# Patient Record
Sex: Male | Born: 1975 | Race: White | Hispanic: No | Marital: Single | Smoking: Heavy tobacco smoker
Health system: Southern US, Community
[De-identification: ages and names within clinical notes are randomized; demographics above are authoritative.]

## PROBLEM LIST (undated history)

## (undated) DIAGNOSIS — F1010 Alcohol abuse, uncomplicated: Secondary | ICD-10-CM

## (undated) DIAGNOSIS — I10 Essential (primary) hypertension: Secondary | ICD-10-CM

---

## 1999-09-03 ENCOUNTER — Emergency Department (HOSPITAL_COMMUNITY): Admission: EM | Admit: 1999-09-03 | Discharge: 1999-09-03 | Payer: Self-pay | Admitting: Emergency Medicine

## 1999-10-26 ENCOUNTER — Emergency Department (HOSPITAL_COMMUNITY): Admission: EM | Admit: 1999-10-26 | Discharge: 1999-10-26 | Payer: Self-pay | Admitting: *Deleted

## 2000-02-29 ENCOUNTER — Emergency Department (HOSPITAL_COMMUNITY): Admission: EM | Admit: 2000-02-29 | Discharge: 2000-02-29 | Payer: Self-pay | Admitting: Emergency Medicine

## 2000-08-01 ENCOUNTER — Emergency Department (HOSPITAL_COMMUNITY): Admission: EM | Admit: 2000-08-01 | Discharge: 2000-08-01 | Payer: Self-pay | Admitting: Emergency Medicine

## 2001-05-19 ENCOUNTER — Emergency Department (HOSPITAL_COMMUNITY): Admission: EM | Admit: 2001-05-19 | Discharge: 2001-05-19 | Payer: Self-pay | Admitting: *Deleted

## 2005-08-26 ENCOUNTER — Emergency Department: Payer: Self-pay | Admitting: Emergency Medicine

## 2006-09-21 ENCOUNTER — Emergency Department: Payer: Self-pay

## 2007-03-21 ENCOUNTER — Emergency Department (HOSPITAL_COMMUNITY): Admission: EM | Admit: 2007-03-21 | Discharge: 2007-03-21 | Payer: Self-pay | Admitting: Emergency Medicine

## 2009-01-04 ENCOUNTER — Emergency Department: Payer: Self-pay | Admitting: Internal Medicine

## 2009-05-31 ENCOUNTER — Emergency Department: Payer: Self-pay | Admitting: Emergency Medicine

## 2009-07-09 ENCOUNTER — Emergency Department: Payer: Self-pay | Admitting: Emergency Medicine

## 2009-08-22 ENCOUNTER — Emergency Department: Payer: Self-pay | Admitting: Emergency Medicine

## 2010-01-15 ENCOUNTER — Emergency Department: Payer: Self-pay | Admitting: Emergency Medicine

## 2010-02-27 ENCOUNTER — Emergency Department: Payer: Self-pay | Admitting: Emergency Medicine

## 2010-03-27 ENCOUNTER — Emergency Department: Payer: Self-pay | Admitting: Emergency Medicine

## 2012-03-18 IMAGING — CT CT HEAD WITHOUT CONTRAST
2 series · 15 of 30 positions shown, 19 images · non-contrast
Comparison: none

REASON FOR EXAM: worsening ha , pain into right eye
COMMENTS:   LMP: (Male)

PROCEDURE:     CT  - CT HEAD WITHOUT CONTRAST  - March 27, 2010 [DATE]
RESULT:
TECHNIQUE: Noncontrast emergent CT of the brain is performed in the standard
fashion and compared to studies dated 02/27/2010.

[Series 2: without · axial · non-contrast · 0.40mm/px · z∈[+740,+860]mm · 13 of 30 slices shown, 17 images]
[im 3/30  brain]
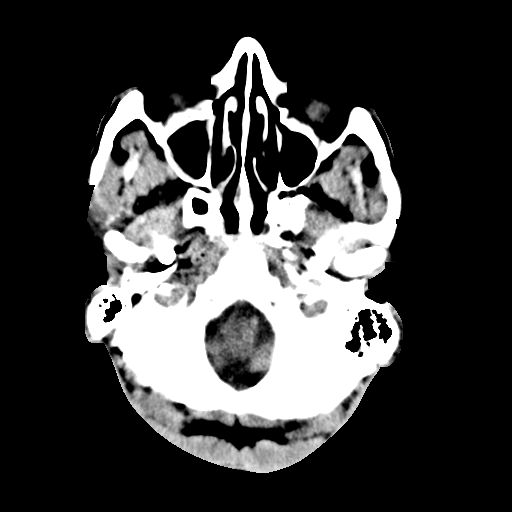
[im 3/30  bone]
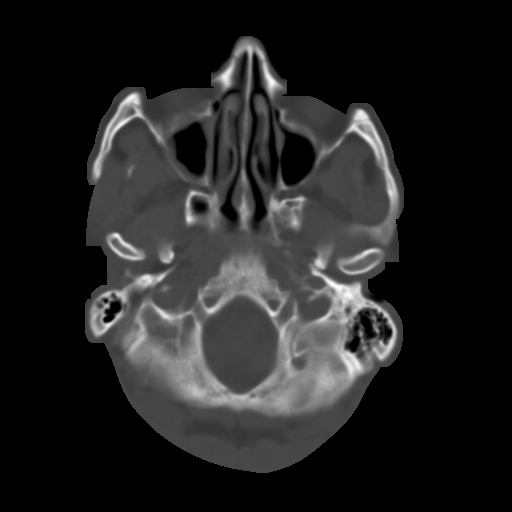
[im 5/30  brain]
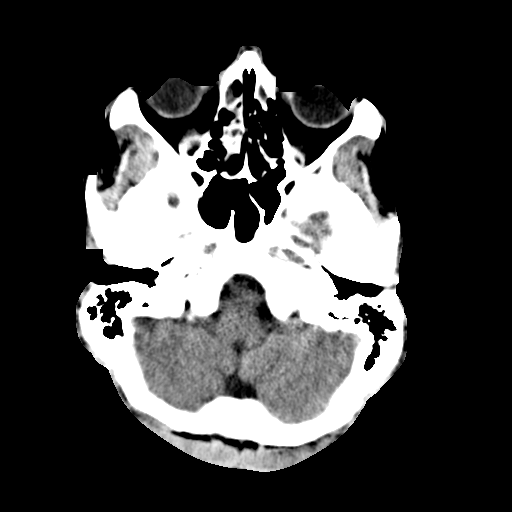
[im 7/30  brain]
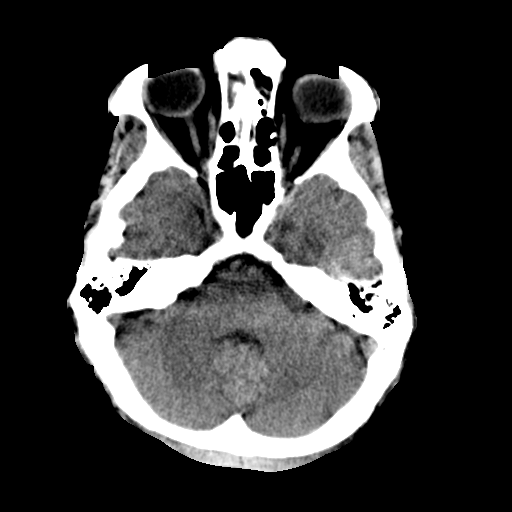
[im 9/30  brain]
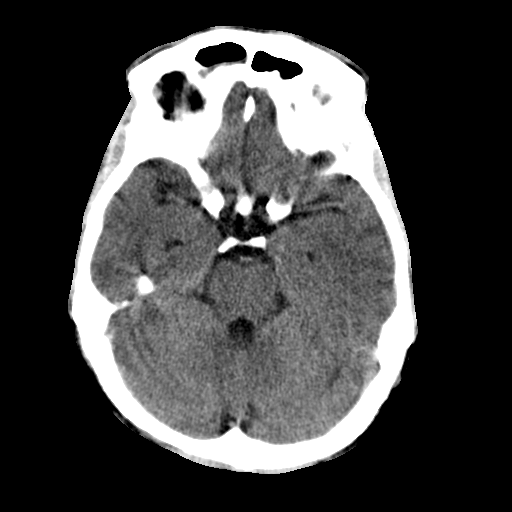
[im 11/30  brain]
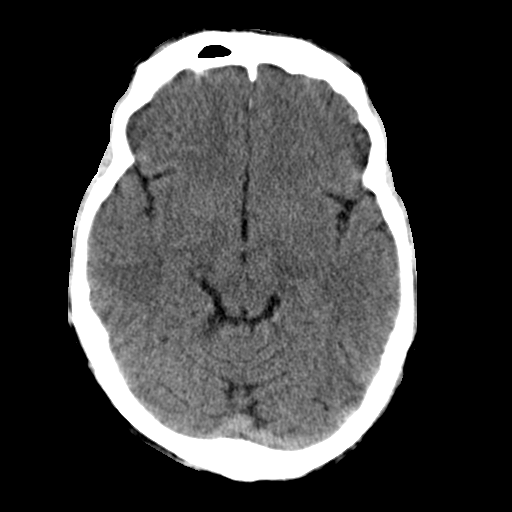
[im 11/30  bone]
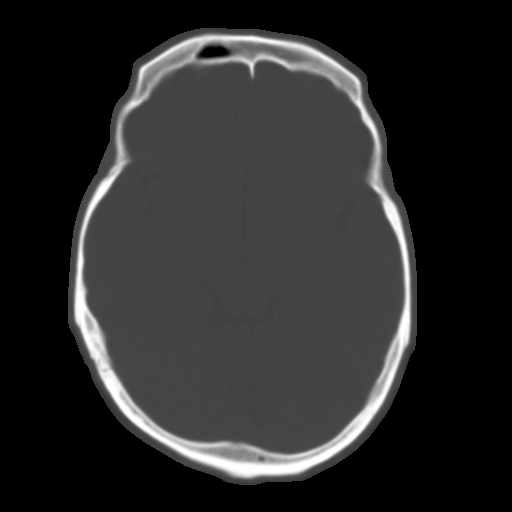
[im 13/30  brain]
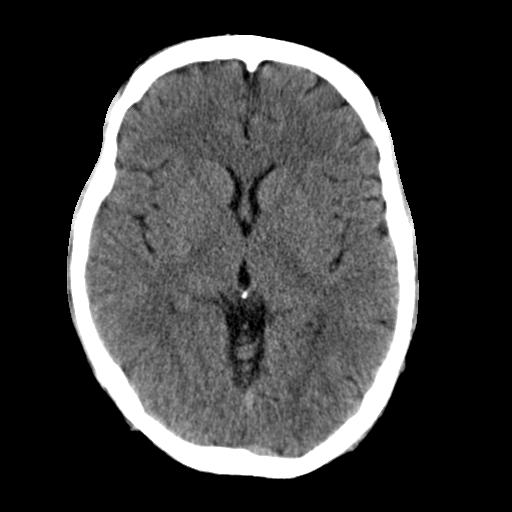
[im 15/30  brain]
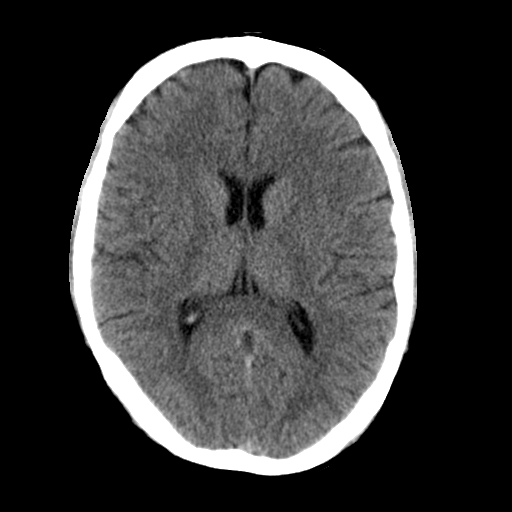
[im 17/30  brain]
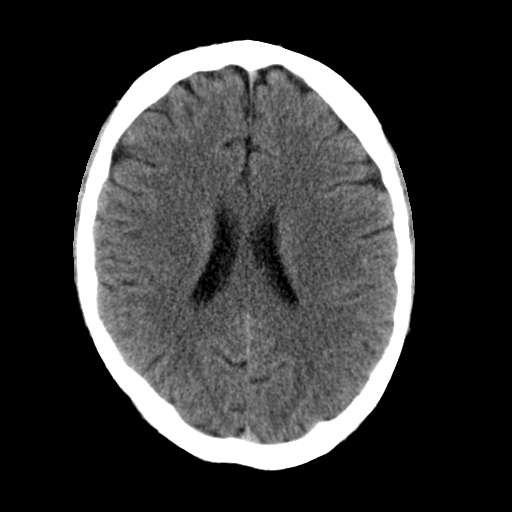
[im 19/30  brain]
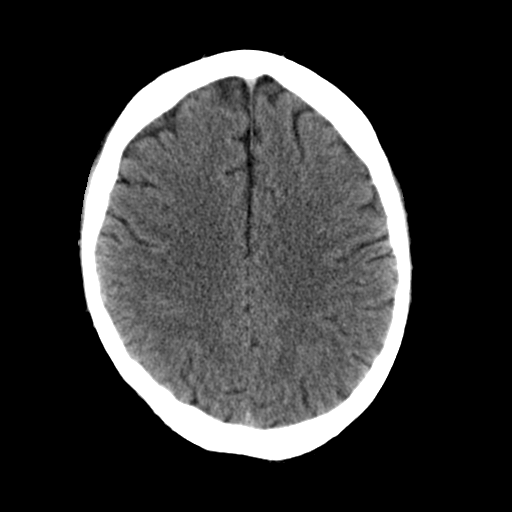
[im 19/30  bone]
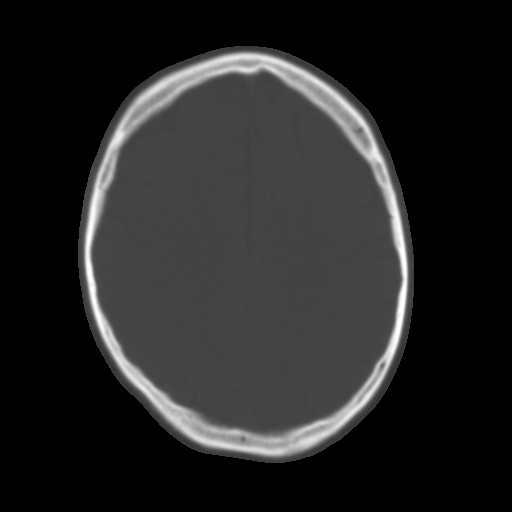
[im 21/30  brain]
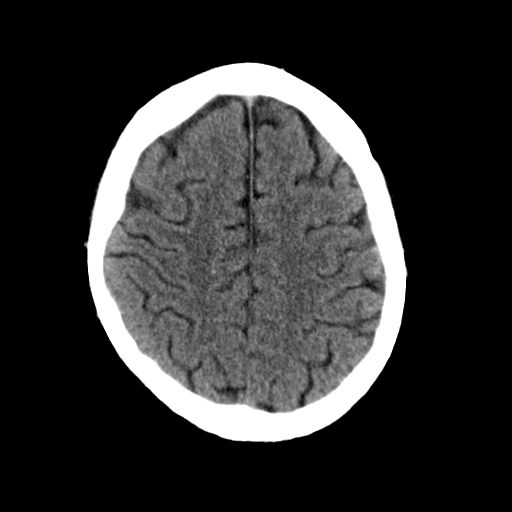
[im 23/30  brain]
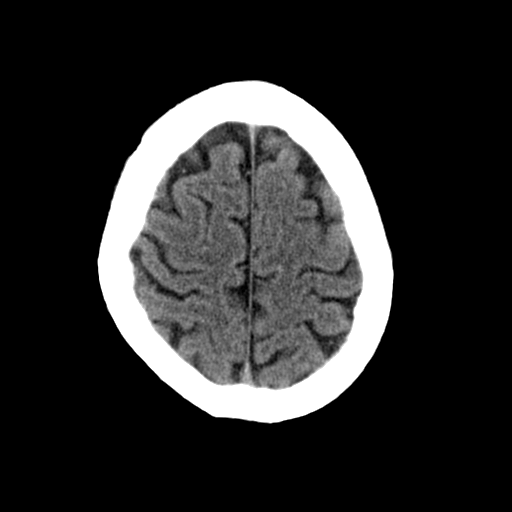
[im 25/30  brain]
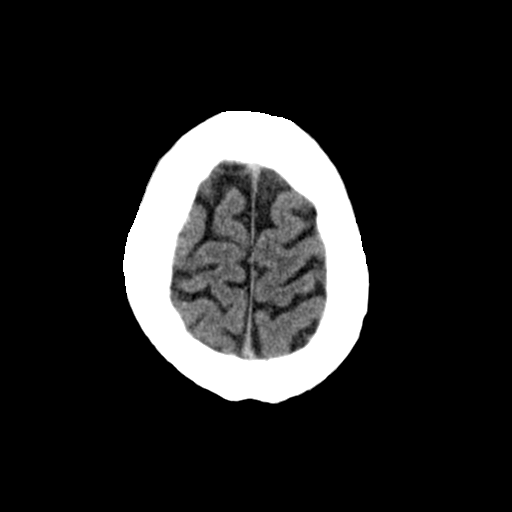
[im 27/30  brain]
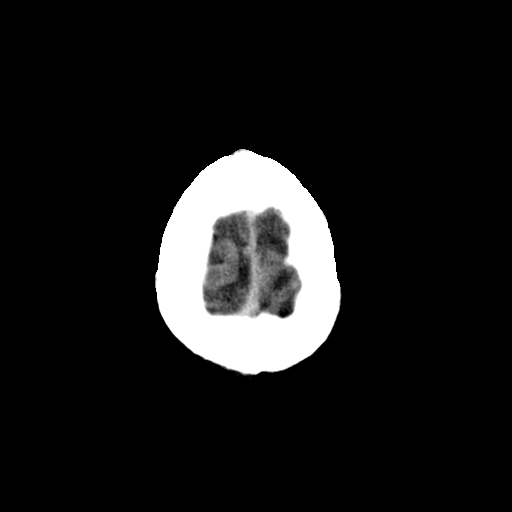
[im 27/30  bone]
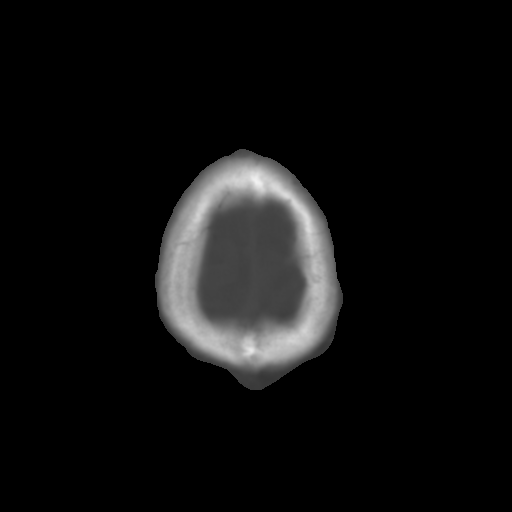

[Series 3: bone · axial · 0.40mm/px · z∈[+740,+760]mm · 2 of 30 slices shown]
[im 3/30  bone]
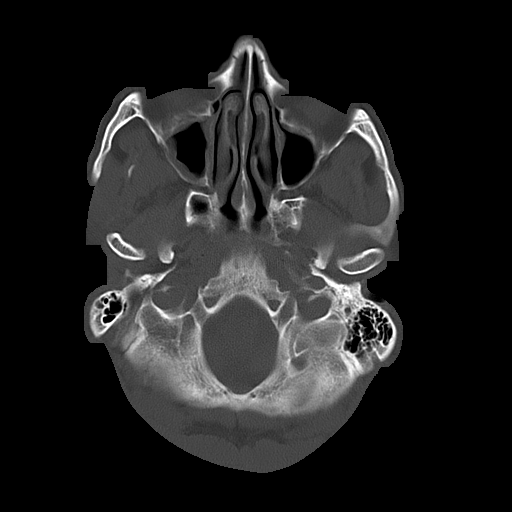
[im 7/30  bone]
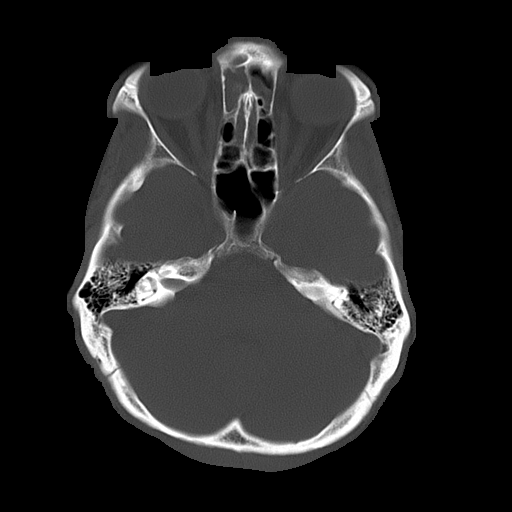

[15 of 30 positions shown; findings below may reference images not displayed]

FINDINGS: There is mucosal thickening diffusely in the right maxillary sinus
and in the ethmoid air cells bilaterally as well as in the right frontal
sinus. The sphenoid sinuses, left frontal and left maxillary sinus appear to
be relatively clear. There is no opacity in the mastoid air cells. The
calvarium is intact. The ventricles and sulci appear to be normal. There is
no intracranial hemorrhage, mass effect or midline shift. There is no
territorial infarct.
IMPRESSION: 1.     Changes of mucosal thickening in the paranasal sinuses as described
without an acute air/fluid level. These may represent changes of chronic
sinus disease.
2.     There is no evidence of an acute intracranial abnormality.

## 2016-02-25 ENCOUNTER — Encounter: Payer: Self-pay | Admitting: Emergency Medicine

## 2016-02-25 ENCOUNTER — Emergency Department
Admission: EM | Admit: 2016-02-25 | Discharge: 2016-02-26 | Disposition: A | Discharge status: Home / Self Care | Payer: Self-pay | Attending: Emergency Medicine | Admitting: Emergency Medicine

## 2016-02-25 ENCOUNTER — Emergency Department: Payer: Self-pay

## 2016-02-25 DIAGNOSIS — S61412A Laceration without foreign body of left hand, initial encounter: Secondary | ICD-10-CM | POA: Insufficient documentation

## 2016-02-25 DIAGNOSIS — Y939 Activity, unspecified: Secondary | ICD-10-CM | POA: Insufficient documentation

## 2016-02-25 DIAGNOSIS — L02512 Cutaneous abscess of left hand: Secondary | ICD-10-CM | POA: Insufficient documentation

## 2016-02-25 DIAGNOSIS — L02519 Cutaneous abscess of unspecified hand: Secondary | ICD-10-CM

## 2016-02-25 DIAGNOSIS — S61432A Puncture wound without foreign body of left hand, initial encounter: Secondary | ICD-10-CM | POA: Insufficient documentation

## 2016-02-25 DIAGNOSIS — L03119 Cellulitis of unspecified part of limb: Secondary | ICD-10-CM

## 2016-02-25 DIAGNOSIS — S62609B Fracture of unspecified phalanx of unspecified finger, initial encounter for open fracture: Secondary | ICD-10-CM

## 2016-02-25 DIAGNOSIS — S62613B Displaced fracture of proximal phalanx of left middle finger, initial encounter for open fracture: Secondary | ICD-10-CM | POA: Insufficient documentation

## 2016-02-25 DIAGNOSIS — Y999 Unspecified external cause status: Secondary | ICD-10-CM | POA: Insufficient documentation

## 2016-02-25 DIAGNOSIS — Y929 Unspecified place or not applicable: Secondary | ICD-10-CM | POA: Insufficient documentation

## 2016-02-25 DIAGNOSIS — F17200 Nicotine dependence, unspecified, uncomplicated: Secondary | ICD-10-CM | POA: Insufficient documentation

## 2016-02-25 LAB — BASIC METABOLIC PANEL
ANION GAP: 9 (ref 5–15)
BUN: 6 mg/dL (ref 6–20)
CHLORIDE: 100 mmol/L — AB (ref 101–111)
CO2: 28 mmol/L (ref 22–32)
CREATININE: 0.74 mg/dL (ref 0.61–1.24)
Calcium: 8.9 mg/dL (ref 8.9–10.3)
GFR calc non Af Amer: >60 mL/min (ref >60)
Glucose, Bld: 100 mg/dL — ABNORMAL HIGH (ref 65–99)
POTASSIUM: 3.7 mmol/L (ref 3.5–5.1)
SODIUM: 137 mmol/L (ref 135–145)

## 2016-02-25 LAB — CBC WITH DIFFERENTIAL/PLATELET
Basophils Absolute: 0.1 10*3/uL (ref 0–0.1)
Basophils Relative: 1 %
Eosinophils Absolute: 0.4 10*3/uL (ref 0–0.7)
HEMATOCRIT: 48.6 % (ref 40.0–52.0)
Hemoglobin: 16.5 g/dL (ref 13.0–18.0)
LYMPHS ABS: 2.4 10*3/uL (ref 1.0–3.6)
MCH: 29.2 pg (ref 26.0–34.0)
MCHC: 33.9 g/dL (ref 32.0–36.0)
MCV: 86.1 fL (ref 80.0–100.0)
Monocytes Absolute: 0.6 10*3/uL (ref 0.2–1.0)
Monocytes Relative: 4 %
NEUTROS ABS: 10 10*3/uL — AB (ref 1.4–6.5)
PLATELETS: 217 10*3/uL (ref 150–440)
RBC: 5.65 MIL/uL (ref 4.40–5.90)
RDW: 14.7 % — ABNORMAL HIGH (ref 11.5–14.5)
WBC: 13.4 10*3/uL — AB (ref 3.8–10.6)

## 2016-02-25 MED ORDER — LIDOCAINE HCL (PF) 1 % IJ SOLN
5.0000 mL | Freq: Once | INTRAMUSCULAR | Status: AC
  Administered 2016-02-25: 5 mL
  Filled 2016-02-25: qty 5

## 2016-02-25 MED ORDER — ONDANSETRON HCL 4 MG/2ML IJ SOLN
4.0000 mg | Freq: Once | INTRAMUSCULAR | Status: AC
  Administered 2016-02-25: 4 mg via INTRAVENOUS
  Filled 2016-02-25: qty 2

## 2016-02-25 MED ORDER — PIPERACILLIN-TAZOBACTAM 3.375 G IVPB 30 MIN
3.3750 g | Freq: Once | INTRAVENOUS | Status: AC
  Administered 2016-02-25: 3.375 g via INTRAVENOUS
  Filled 2016-02-25: qty 50

## 2016-02-25 MED ORDER — OXYCODONE-ACETAMINOPHEN 5-325 MG PO TABS: 1.0000 | ORAL_TABLET | Freq: Three times a day (TID) | ORAL | Status: DC | PRN

## 2016-02-25 MED ORDER — KETOROLAC TROMETHAMINE 30 MG/ML IJ SOLN
30.0000 mg | Freq: Once | INTRAMUSCULAR | Status: AC
  Administered 2016-02-25: 30 mg via INTRAVENOUS
  Filled 2016-02-25: qty 1

## 2016-02-25 MED ORDER — AMOXICILLIN-POT CLAVULANATE 875-125 MG PO TABS: 1.0000 | ORAL_TABLET | Freq: Two times a day (BID) | ORAL | Status: DC

## 2016-02-25 MED ORDER — MORPHINE SULFATE (PF) 4 MG/ML IV SOLN
4.0000 mg | Freq: Once | INTRAVENOUS | Status: AC
Start: 2016-02-25 — End: 2016-02-25
  Administered 2016-02-25: 4 mg via INTRAVENOUS
  Filled 2016-02-25: qty 1

## 2016-02-25 MED ORDER — TETANUS-DIPHTH-ACELL PERTUSSIS 5-2.5-18.5 LF-MCG/0.5 IM SUSP
0.5000 mL | Freq: Once | INTRAMUSCULAR | Status: AC
  Administered 2016-02-25: 0.5 mL via INTRAMUSCULAR
  Filled 2016-02-25: qty 0.5

## 2016-02-25 MED ORDER — MORPHINE SULFATE (PF) 4 MG/ML IV SOLN
4.0000 mg | Freq: Once | INTRAVENOUS | Status: AC
  Administered 2016-02-25: 4 mg via INTRAVENOUS
  Filled 2016-02-25: qty 1

## 2016-02-25 NOTE — Discharge Instructions (Signed)
Abscess An abscess (boil or furuncle) is an infected area on or under the skin. This area is filled with yellowish-white fluid (pus) and other material (debris). HOME CARE   Only take medicines as told by your doctor.  If you were given antibiotic medicine, take it as directed. Finish the medicine even if you start to feel better.  If gauze is used, follow your doctor's directions for changing the gauze.  To avoid spreading the infection:  Keep your abscess covered with a bandage.  Wash your hands well.  Do not share personal care items, towels, or whirlpools with others.  Avoid skin contact with others.  Keep your skin and clothes clean around the abscess.  Keep all doctor visits as told. GET HELP RIGHT AWAY IF:   You have more pain, puffiness (swelling), or redness in the wound site.  You have more fluid or blood coming from the wound site.  You have muscle aches, chills, or you feel sick.  You have a fever. MAKE SURE YOU:   Understand these instructions.  Will watch your condition.  Will get help right away if you are not doing well or get worse.   This information is not intended to replace advice given to you by your health care provider. Make sure you discuss any questions you have with your health care provider.   Document Released: 02/11/2008 Document Revised: 02/24/2012 Document Reviewed: 11/08/2011 Elsevier Interactive Patient Education 2016 [employer]  Cellulitis Cellulitis is an infection of the skin and the tissue under the skin. The infected area is usually red and tender. This happens most often in the arms and lower legs. HOME CARE   Take your antibiotic medicine as told. Finish the medicine even if you start to feel better.  Keep the infected arm or leg raised (elevated).  Put a warm cloth on the area up to 4 times per day.  Only take medicines as told by your doctor.  Keep all doctor visits as told. GET HELP IF:  You see red streaks on  the skin coming from the infected area.  Your red area gets bigger or turns a dark color.  Your bone or joint under the infected area is painful after the skin heals.  Your infection comes back in the same area or different area.  You have a puffy (swollen) bump in the infected area.  You have new symptoms.  You have a fever. GET HELP RIGHT AWAY IF:   You feel very sleepy.  You throw up (vomit) or have watery poop (diarrhea).  You feel sick and have muscle aches and pains.   This information is not intended to replace advice given to you by your health care provider. Make sure you discuss any questions you have with your health care provider.   Document Released: 02/11/2008 Document Revised: 05/16/2015 Document Reviewed: 11/10/2011 Elsevier Interactive Patient Education 2016 [employer]  Finger Fracture Finger fractures are breaks in the bones of the fingers. There are many types of fractures. There are also different ways of treating these fractures. Your doctor will talk with you about the best way to treat your fracture. Injury is the main cause of broken fingers. This includes:  Injuries while playing sports.  Workplace injuries.  Falls. HOME CARE  Follow your doctor's instructions for:  Activities.  Exercises.  Physical therapy.  Take medicines only as told by your doctor for pain, discomfort, or fever. GET HELP IF: You have pain or swelling that limits:  The motion of your fingers.  The use of your fingers. GET HELP RIGHT AWAY IF:  You cannot feel your fingers, or your fingers become numb.   This information is not intended to replace advice given to you by your health care provider. Make sure you discuss any questions you have with your health care provider.   Document Released: 02/11/2008 Document Revised: 09/15/2014 Document Reviewed: 04/06/2013 Elsevier Interactive Patient Education 2016 [employer]  Percutaneous Abscess Drain An abscess  is a collection of infected fluid inside the body. Your health care provider may decide to remove or drain the infected fluid from the area by placing a thin needle into the abscess. Usually, a small tube is left in place to drain the abscess fluid. The abscess fluid may take a few days to drain. LET [facility] CARE PROVIDER KNOW ABOUT:  Any allergies you have.  All medicines you are taking, including vitamins, herbs, eye drops, creams, and over-the-counter medicines. This includes steroid medicines by mouth or cream.  Previous problems you or members of your family have had with the use of anesthetics.  Any blood disorders you have.  Previous surgeries you have had.  Possibility of pregnancy, if this applies.  Medical conditions you have.  Any history of smoking. RISKS AND COMPLICATIONS Generally, this is a safe procedure. However, problems can occur and include:   Infection.  Allergic reaction to materials used (such as contrast dye).  Damage to a nearby organ or tissue.  Bleeding.  Blockage of a tube placed to drain the abscess, requiring placement of a new drainage tube.  A need to repeat the procedure.  Failure of the procedure to adequately drain the abscess, requiring an open surgical procedure to do so. BEFORE THE PROCEDURE   Ask your health care provider about:  Changing or stopping your regular medicines. This is especially important if you are taking diabetes medicines or blood thinners.  Taking medicines such as aspirin and ibuprofen. These medicines can thin your blood. Do not take these medicines before your procedure if your health care provider asks you not to.  Your health care provider may do some blood or urine tests. These will help your health care provider learn how well your kidneys and liver are working and how well your blood clots.  Do not eat or drink anything after midnight on the night before the procedure or as directed by your health care  provider.  Make arrangements for someone to drive you home after the procedure.  PROCEDURE   An IV tube will be placed in your arm. Medicine will be able to flow directly into your body through this tube.  You will lie on an X-ray table.  Your heart rate, blood pressure, and breathing will be monitored.   Your oxygen level will also be watched during the procedure. Supplemental oxygen may be given if necessary.  The skin around the area where the drainage tube (catheter) will be placed will be cleaned and numbed.  A small cut (incision) will then be made to insert the drainage tube. The drainage tube will be inserted using X-ray or CT scan to help direct where it should be placed.  The drainage tube will be guided into the abscess to drain the infected fluid.  The drainage tube may stay in place and be connected to a bag outside your body. It will stay until the fluid has stopped draining and the infection is gone. AFTER THE PROCEDURE  You will be  taken to a recovery area where you will stay until the medicines have worn off.  You will stay in bed for several hours.  Your progress will be monitored.   Your blood pressure and pulse will be checked often.   The area of the incision will be checked often.  You may have some pain or feel sick. Tell your health care provider.  As you begin to feel better, you may be given ice, fluids, and food.   When you can walk, drink, eat, and use the bathroom, you may be able to go home.   This information is not intended to replace advice given to you by your health care provider. Make sure you discuss any questions you have with your health care provider.   Document Released: 01/09/2014 Document Reviewed: 01/09/2014 Elsevier Interactive Patient Education [employer]   Take the antibiotic as directed and the pain medicine as needed. Keep the wound clean, dry, and covered. Keep the hand elevated to reduce swelling.  Follow-up with Dr. Rosita Kea on Wednesday for recheck.
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
## 2016-02-27 ENCOUNTER — Emergency Department
Admission: EM | Admit: 2016-02-27 | Discharge: 2016-02-27 | Disposition: A | Discharge status: Home / Self Care | Payer: Self-pay | Attending: Emergency Medicine | Admitting: Emergency Medicine

## 2016-02-27 DIAGNOSIS — F17200 Nicotine dependence, unspecified, uncomplicated: Secondary | ICD-10-CM | POA: Insufficient documentation

## 2016-02-27 DIAGNOSIS — S62613B Displaced fracture of proximal phalanx of left middle finger, initial encounter for open fracture: Secondary | ICD-10-CM | POA: Insufficient documentation

## 2016-02-27 DIAGNOSIS — S6292XA Unspecified fracture of left wrist and hand, initial encounter for closed fracture: Secondary | ICD-10-CM

## 2016-02-27 DIAGNOSIS — Y939 Activity, unspecified: Secondary | ICD-10-CM | POA: Insufficient documentation

## 2016-02-27 DIAGNOSIS — Y999 Unspecified external cause status: Secondary | ICD-10-CM | POA: Insufficient documentation

## 2016-02-27 DIAGNOSIS — Y929 Unspecified place or not applicable: Secondary | ICD-10-CM | POA: Insufficient documentation

## 2016-02-27 DIAGNOSIS — S61412A Laceration without foreign body of left hand, initial encounter: Secondary | ICD-10-CM

## 2016-02-27 MED ORDER — IBUPROFEN 800 MG PO TABS
800.0000 mg | ORAL_TABLET | Freq: Once | ORAL | Status: AC
  Administered 2016-02-27: 800 mg via ORAL
  Filled 2016-02-27: qty 1

## 2016-02-27 MED ORDER — OXYCODONE-ACETAMINOPHEN 5-325 MG PO TABS
1.0000 | ORAL_TABLET | Freq: Once | ORAL | Status: AC
  Administered 2016-02-27: 1 via ORAL
  Filled 2016-02-27: qty 1

## 2016-02-27 MED ORDER — OXYCODONE-ACETAMINOPHEN 7.5-325 MG PO TABS: 1.0000 | ORAL_TABLET | ORAL | Status: AC | PRN

## 2016-02-27 NOTE — Discharge Instructions (Signed)
Wear splint and continue antibiotics until evaluation by orthopedics.
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
## 2016-02-27 NOTE — ED Notes (Signed)
Called for triage X1 

## 2016-03-01 LAB — CULTURE, BLOOD (ROUTINE X 2)
CULTURE: NO GROWTH
Culture: NO GROWTH

## 2019-07-28 ENCOUNTER — Emergency Department (HOSPITAL_COMMUNITY): Payer: Self-pay

## 2019-07-28 ENCOUNTER — Other Ambulatory Visit: Payer: Self-pay

## 2019-07-28 ENCOUNTER — Encounter (HOSPITAL_COMMUNITY): Payer: Self-pay | Admitting: Emergency Medicine

## 2019-07-28 ENCOUNTER — Emergency Department (HOSPITAL_COMMUNITY)
Admission: EM | Admit: 2019-07-28 | Discharge: 2019-07-28 | Disposition: A | Discharge status: Home / Self Care | Payer: Self-pay | Attending: Emergency Medicine | Admitting: Emergency Medicine

## 2019-07-28 DIAGNOSIS — Z20822 Contact with and (suspected) exposure to covid-19: Secondary | ICD-10-CM

## 2019-07-28 DIAGNOSIS — Z20828 Contact with and (suspected) exposure to other viral communicable diseases: Secondary | ICD-10-CM | POA: Insufficient documentation

## 2019-07-28 DIAGNOSIS — R000 Tachycardia, unspecified: Secondary | ICD-10-CM | POA: Insufficient documentation

## 2019-07-28 DIAGNOSIS — R05 Cough: Secondary | ICD-10-CM | POA: Insufficient documentation

## 2019-07-28 DIAGNOSIS — F17210 Nicotine dependence, cigarettes, uncomplicated: Secondary | ICD-10-CM | POA: Insufficient documentation

## 2019-07-28 DIAGNOSIS — R509 Fever, unspecified: Secondary | ICD-10-CM | POA: Insufficient documentation

## 2019-07-28 LAB — SARS CORONAVIRUS 2 (TAT 6-24 HRS): SARS Coronavirus 2: NEGATIVE

## 2019-07-28 MED ORDER — ALBUTEROL SULFATE HFA 108 (90 BASE) MCG/ACT IN AERS
4.0000 | INHALATION_SPRAY | RESPIRATORY_TRACT | Status: DC | PRN
  Administered 2019-07-28: 15:00:00 4 via RESPIRATORY_TRACT
  Filled 2019-07-28: qty 6.7

## 2019-07-28 MED ORDER — BENZONATATE 100 MG PO CAPS: 100.0000 mg | ORAL_CAPSULE | Freq: Three times a day (TID) | ORAL | 0 refills | Status: DC

## 2019-07-28 MED ORDER — BENZONATATE 100 MG PO CAPS
100.0000 mg | ORAL_CAPSULE | Freq: Once | ORAL | Status: AC
  Administered 2019-07-28: 15:00:00 100 mg via ORAL
  Filled 2019-07-28: qty 1

## 2019-07-28 NOTE — ED Triage Notes (Signed)
Cough and fever x 6 days

## 2019-07-28 NOTE — ED Provider Notes (Signed)
[facility] Provider Note   CSN: [id number] Arrival date & time: 07/28/19  1223     History   Chief Complaint Chief Complaint  Patient presents with  . Cough  . Fever    HPI NAMEER [facility] is a 43 y.o. male.     The history is provided by the patient. No language interpreter was used.  Cough Associated symptoms: fever   Fever Associated symptoms: cough      43 year old male with history of tobacco abuse presenting for evaluation of cold symptoms.  Patient report for nearly a week he has been feeling well.  He reported having throbbing headache, fever as high as 101, cough productive with sputum, and some throat irritation.  Yesterday he noticed increasing cough and headache.  He also complaining of loss of taste but no loss of smell.  He does not complain of any vomiting or diarrhea no significant shortness of breath or abdominal pain or back pain.  No myalgias.  Denies any recent sick contact.  He has been taking some over-the-counter Mucinex without adequate relief.  He does smokes 2 packs of cigarettes a day.  He denies history of asthma.  History reviewed. No pertinent past medical history.  There are no active problems to display for this patient.   History reviewed. No pertinent surgical history.      Home Medications    Prior to Admission medications   Medication Sig Start Date End Date Taking? Authorizing Provider  amoxicillin-clavulanate (AUGMENTIN) 875-125 MG tablet Take 1 tablet by mouth 2 (two) times daily. 02/25/16   Menshew, Dannielle Karvonen, PA-C  oxyCODONE-acetaminophen (ROXICET) 5-325 MG tablet Take 1 tablet by mouth every 8 (eight) hours as needed. 02/25/16   Menshew, Dannielle Karvonen, PA-C    Family History No family history on file.  Social History Social History   Tobacco Use  . Smoking status: Heavy Tobacco Smoker  . Smokeless tobacco: Never Used  Substance Use Topics  . Alcohol use: Yes  . Drug use: No      Allergies   Patient has no known allergies.   Review of Systems Review of Systems  Constitutional: Positive for fever.  Respiratory: Positive for cough.   All other systems reviewed and are negative.    Physical Exam Updated Vital Signs BP 129/89 (BP Location: Right Arm)   Pulse (!) 105   Temp 98.2 F (36.8 C)   Resp 16   SpO2 100%   Physical Exam Vitals signs and nursing note reviewed.  Constitutional:      General: He is not in acute distress.    Appearance: He is well-developed.  HENT:     Head: Atraumatic.  Eyes:     Conjunctiva/sclera: Conjunctivae normal.  Neck:     Musculoskeletal: Neck supple.  Cardiovascular:     Rate and Rhythm: Tachycardia present.     Pulses: Normal pulses.     Heart sounds: Normal heart sounds.  Pulmonary:     Breath sounds: Wheezing and rhonchi present. No rales.     Comments: Scattered rhonchi heard with occasional faint expiratory wheezes. Abdominal:     Palpations: Abdomen is soft.     Tenderness: There is no abdominal tenderness.  Skin:    Findings: No rash.  Neurological:     Mental Status: He is alert and oriented to person, place, and time.  Psychiatric:        Mood and Affect: Mood normal.  ED Treatments / Results  Labs (all labs ordered are listed, but only abnormal results are displayed) Labs Reviewed  SARS CORONAVIRUS 2 (TAT 6-24 HRS)    EKG None  Radiology Dg Chest Portable 1 View  Result Date: 07/28/2019 CLINICAL DATA:  Cough, fever EXAM: PORTABLE CHEST 1 VIEW COMPARISON:  03/27/2010 FINDINGS: The heart size and mediastinal contours are within normal limits. Both lungs are clear. The visualized skeletal structures are unremarkable. IMPRESSION: No acute abnormality of the lungs in AP portable projection. Electronically Signed   By: Lauralyn Primes M.D.   On: 07/28/2019 14:30    Procedures Procedures (including critical care time)  Medications Ordered in ED Medications  albuterol (VENTOLIN  HFA) 108 (90 Base) MCG/ACT inhaler 4 puff (4 puffs Inhalation Given 07/28/19 1512)  benzonatate (TESSALON) capsule 100 mg (100 mg Oral Given 07/28/19 1512)     Initial Impression / Assessment and Plan / ED Course  I have reviewed the triage vital signs and the nursing notes.  Pertinent labs & imaging results that were available during my care of the patient were reviewed by me and considered in my medical decision making (see chart for details).        BP 129/89 (BP Location: Right Arm)   Pulse (!) 105   Temp 98.2 F (36.8 C)   Resp 16   SpO2 100%    Final Clinical Impressions(s) / ED Diagnoses   Final diagnoses:  Suspected COVID-19 virus infection    ED Discharge Orders    None     [facility] was evaluated in Emergency Department on 07/28/2019 for the symptoms described in the history of present illness. He was evaluated in the context of the global COVID-19 pandemic, which necessitated consideration that the patient might be at risk for infection with the SARS-CoV-2 virus that causes COVID-19. Institutional protocols and algorithms that pertain to the evaluation of patients at risk for COVID-19 are in a state of rapid change based on information released by regulatory bodies including the CDC and federal and state organizations. These policies and algorithms were followed during the patient's care in the ED.    Fayrene Helper, PA-C 07/28/19 1517    Little, Ambrose Finland, MD 07/28/19 1534

## 2019-07-28 NOTE — ED Notes (Signed)
Patient verbalizes understanding of discharge instructions. Opportunity for questioning and answers were provided. Armband removed by staff, pt discharged from ED.  

## 2020-03-09 ENCOUNTER — Emergency Department
Admission: EM | Admit: 2020-03-09 | Discharge: 2020-03-09 | Disposition: A | Discharge status: Home / Self Care | Payer: Self-pay | Attending: Emergency Medicine | Admitting: Emergency Medicine

## 2020-03-09 ENCOUNTER — Other Ambulatory Visit: Payer: Self-pay

## 2020-03-09 DIAGNOSIS — Z5321 Procedure and treatment not carried out due to patient leaving prior to being seen by health care provider: Secondary | ICD-10-CM | POA: Insufficient documentation

## 2020-03-09 DIAGNOSIS — F19939 Other psychoactive substance use, unspecified with withdrawal, unspecified: Secondary | ICD-10-CM | POA: Insufficient documentation

## 2020-03-09 HISTORY — DX: Alcohol abuse, uncomplicated: F10.10

## 2020-03-09 NOTE — ED Notes (Signed)
Called pt several times no answer, walked around ER, went outside no answer

## 2020-03-09 NOTE — ED Notes (Signed)
This patient was triaged by this RN, signed in under the wrong name.  Patient states he [place] saboxone's [place] the [place] and is not due for another RX until 03/14/2020.  Denies SI/ HI.  States "I just need my medication".

## 2020-03-09 NOTE — ED Triage Notes (Signed)
States lost his saboxone about 4-5 days ago and started drinking again.  Here for detox.  States last drank just PTA.  Calm and cooperative  NAD

## 2021-01-17 ENCOUNTER — Other Ambulatory Visit: Payer: Self-pay

## 2021-01-17 ENCOUNTER — Emergency Department
Admission: EM | Admit: 2021-01-17 | Discharge: 2021-01-17 | Disposition: A | Discharge status: Home / Self Care | Payer: Self-pay | Attending: Emergency Medicine | Admitting: Emergency Medicine

## 2021-01-17 DIAGNOSIS — F10939 Alcohol use, unspecified with withdrawal, unspecified: Secondary | ICD-10-CM

## 2021-01-17 DIAGNOSIS — F1020 Alcohol dependence, uncomplicated: Secondary | ICD-10-CM | POA: Insufficient documentation

## 2021-01-17 DIAGNOSIS — E876 Hypokalemia: Secondary | ICD-10-CM

## 2021-01-17 DIAGNOSIS — F10988 Alcohol use, unspecified with other alcohol-induced disorder: Secondary | ICD-10-CM

## 2021-01-17 DIAGNOSIS — F10230 Alcohol dependence with withdrawal, uncomplicated: Secondary | ICD-10-CM

## 2021-01-17 DIAGNOSIS — F1010 Alcohol abuse, uncomplicated: Secondary | ICD-10-CM

## 2021-01-17 DIAGNOSIS — F10220 Alcohol dependence with intoxication, uncomplicated: Secondary | ICD-10-CM

## 2021-01-17 DIAGNOSIS — F17200 Nicotine dependence, unspecified, uncomplicated: Secondary | ICD-10-CM | POA: Insufficient documentation

## 2021-01-17 DIAGNOSIS — D6959 Other secondary thrombocytopenia: Secondary | ICD-10-CM

## 2021-01-17 DIAGNOSIS — F10239 Alcohol dependence with withdrawal, unspecified: Secondary | ICD-10-CM

## 2021-01-17 DIAGNOSIS — Y908 Blood alcohol level of 240 mg/100 ml or more: Secondary | ICD-10-CM | POA: Insufficient documentation

## 2021-01-17 LAB — URINE DRUG SCREEN, QUALITATIVE (ARMC ONLY)
Amphetamines, Ur Screen: NOT DETECTED
Barbiturates, Ur Screen: NOT DETECTED
Benzodiazepine, Ur Scrn: NOT DETECTED
Cannabinoid 50 Ng, Ur ~~LOC~~: NOT DETECTED
Cocaine Metabolite,Ur ~~LOC~~: NOT DETECTED
MDMA (Ecstasy)Ur Screen: NOT DETECTED
Methadone Scn, Ur: NOT DETECTED
Opiate, Ur Screen: NOT DETECTED
Phencyclidine (PCP) Ur S: NOT DETECTED
Tricyclic, Ur Screen: NOT DETECTED

## 2021-01-17 LAB — COMPREHENSIVE METABOLIC PANEL
ALT: 45 U/L — ABNORMAL HIGH (ref 0–44)
AST: 64 U/L — ABNORMAL HIGH (ref 15–41)
Albumin: 4.2 g/dL (ref 3.5–5.0)
Alkaline Phosphatase: 91 U/L (ref 38–126)
Anion gap: 14 (ref 5–15)
BUN: 6 mg/dL (ref 6–20)
CO2: 24 mmol/L (ref 22–32)
Calcium: 8.6 mg/dL — ABNORMAL LOW (ref 8.9–10.3)
Chloride: 105 mmol/L (ref 98–111)
Creatinine, Ser: 0.54 mg/dL — ABNORMAL LOW (ref 0.61–1.24)
GFR, Estimated: >60 mL/min (ref >60)
Glucose, Bld: 92 mg/dL (ref 70–99)
Potassium: 2.9 mmol/L — ABNORMAL LOW (ref 3.5–5.1)
Sodium: 143 mmol/L (ref 135–145)
Total Bilirubin: 0.6 mg/dL (ref 0.3–1.2)
Total Protein: 6.9 g/dL (ref 6.5–8.1)

## 2021-01-17 LAB — ETHANOL: Alcohol, Ethyl (B): 442 mg/dL (ref <10)

## 2021-01-17 LAB — CBC WITH DIFFERENTIAL/PLATELET
Abs Immature Granulocytes: 0.02 10*3/uL (ref 0.00–0.07)
Basophils Absolute: 0 10*3/uL (ref 0.0–0.1)
Basophils Relative: 1 %
Eosinophils Absolute: 0.2 10*3/uL (ref 0.0–0.5)
Eosinophils Relative: 3 %
HCT: 44.6 % (ref 39.0–52.0)
Hemoglobin: 15.5 g/dL (ref 13.0–17.0)
Immature Granulocytes: 0 %
Lymphocytes Relative: 37 %
Lymphs Abs: 1.9 10*3/uL (ref 0.7–4.0)
MCH: 30.4 pg (ref 26.0–34.0)
MCHC: 34.8 g/dL (ref 30.0–36.0)
MCV: 87.5 fL (ref 80.0–100.0)
Monocytes Absolute: 0.3 10*3/uL (ref 0.1–1.0)
Monocytes Relative: 5 %
Neutro Abs: 2.7 10*3/uL (ref 1.7–7.7)
Neutrophils Relative %: 54 %
Platelets: 81 10*3/uL — ABNORMAL LOW (ref 150–400)
RBC: 5.1 MIL/uL (ref 4.22–5.81)
RDW: 14 % (ref 11.5–15.5)
Smear Review: NORMAL
WBC: 5.1 10*3/uL (ref 4.0–10.5)
nRBC: 0 % (ref 0.0–0.2)

## 2021-01-17 MED ORDER — ONDANSETRON 4 MG PO TBDP
4.0000 mg | ORAL_TABLET | Freq: Once | ORAL | Status: AC
  Administered 2021-01-17: 4 mg via ORAL
  Filled 2021-01-17: qty 1

## 2021-01-17 MED ORDER — POTASSIUM CHLORIDE CRYS ER 20 MEQ PO TBCR
40.0000 meq | EXTENDED_RELEASE_TABLET | Freq: Once | ORAL | Status: AC
  Administered 2021-01-17: 40 meq via ORAL
  Filled 2021-01-17: qty 2

## 2021-01-17 MED ORDER — CHLORDIAZEPOXIDE HCL 25 MG PO CAPS
50.0000 mg | ORAL_CAPSULE | Freq: Three times a day (TID) | ORAL | Status: DC
  Administered 2021-01-17 (×2): 50 mg via ORAL
  Filled 2021-01-17 (×2): qty 2

## 2021-01-17 NOTE — ED Notes (Signed)
Pt given lunch tray. Pt has no requests at this time. Will continue to monitor. 

## 2021-01-17 NOTE — ED Notes (Signed)
Pt eating lunch

## 2021-01-17 NOTE — ED Notes (Signed)
Up to bathroom

## 2021-01-17 NOTE — ED Notes (Signed)
Dr.Clapacs at bedside  

## 2021-01-17 NOTE — ED Notes (Addendum)
Pt daughter requesting to speak with charge nurse regarding wanting update on patient. Expressed to daughter by Diplomatic Services operational officer that pt had phone call and will be provided phone for next phone call hours.  Charge RN informed pt daughter, that due to HIPPA laws, unable to give out medical information without confirming permission from patient. Charge RN expressed that visiting hours are between 12-2PM; offered daughter to come visit. Daughter states she will come visit pt. Awakened pt to inform that daughter coming to visit, states that he is ready to leave. Appears clinically sober, eats and drinks without difficulty. Alert and oriented X4. EDP updated. TTS updated. Pt declines wanting to go to [facility], states he is wanting to be discharged. Pt given phone and calling daughter currently.

## 2021-01-17 NOTE — ED Notes (Addendum)
Pt alert and oriented X 4, stable for discharge. RR even and unlabored, color WNL. Discussed discharge instructions and follow-up as directed. Discharge medications discussed if prescribed. Pt had opportunity to ask questions, and RN to provide patient/family eduction. Ambulated pt to car to meet daughter. No further needs expressed. Left with all of belongings, discharge paperwork and [facility] card.

## 2021-01-17 NOTE — ED Provider Notes (Signed)
Patient stating that he no longer wants to wait.  Would like to be discharged home.  He is here voluntary.  Will be given referral to outpatient detox.   Willy Eddy, MD 01/17/21 1344

## 2021-01-17 NOTE — ED Triage Notes (Signed)
Pt here for alcohol detox, no other drug use Denies any SI or HI.

## 2021-01-17 NOTE — ED Notes (Signed)
Given  breakfast

## 2021-01-17 NOTE — Consult Note (Signed)
[facility] Face-to-Face Psychiatry Consult   Reason for Consult: Consult for 45 year old man presents to the emergency room requesting detox from alcohol Referring Physician: Roxan Hockey Patient Identification: [facility] MRN:  [id number] Principal Diagnosis: Alcohol withdrawal (HCC) Diagnosis:  Principal Problem:   Alcohol withdrawal (HCC) Active Problems:   Alcohol abuse   Total Time spent with patient: 1 hour  Subjective:   [facility] is a 45 y.o. male patient admitted with "I need to get detoxed".  HPI: Patient seen chart reviewed.  45 year old man presented voluntarily to the emergency room requesting detox.  Blood alcohol level on initial check was 440.  Patient states that he drinks almost 1/2 gallon of liquor daily and has been doing so for years.  Last drink yesterday.  Reports that his girlfriend is telling him that he has to get off of the alcohol or she is going to throw him out.  Mood is dysphoric but he denies any suicidal thoughts.  Denies any current psychotic symptoms.  Mild nausea but still taking care of ADLs.  Able to walk without any assistance  Past Psychiatric History: No previous detox or inpatient substance abuse treatment.  Risk to Self:   Risk to Others:   Prior Inpatient Therapy:   Prior Outpatient Therapy:    Past Medical History:  Past Medical History:  Diagnosis Date  . Alcohol abuse    No past surgical history on file. Family History: No family history on file. Family Psychiatric  History: None reported Social History:  Social History   Substance and Sexual Activity  Alcohol Use Yes     Social History   Substance and Sexual Activity  Drug Use No    Social History   Socioeconomic History  . Marital status: Single    Spouse name: Not on file  . Number of children: Not on file  . Years of education: Not on file  . Highest education level: Not on file  Occupational History  . Not on file  Tobacco Use  . Smoking status: Heavy Tobacco  Smoker  . Smokeless tobacco: Never Used  Substance and Sexual Activity  . Alcohol use: Yes  . Drug use: No  . Sexual activity: Yes    Birth control/protection: None  Other Topics Concern  . Not on file  Social History Narrative  . Not on file   Social Determinants of Health   Financial Resource Strain: Not on file  Food Insecurity: Not on file  Transportation Needs: Not on file  Physical Activity: Not on file  Stress: Not on file  Social Connections: Not on file   Additional Social History:    Allergies:  No Known Allergies  Labs:  Results for orders placed or performed during the hospital encounter of 01/17/21 (from the past 48 hour(s))  Ethanol     Status: Abnormal   Collection Time: 01/17/21  4:16 AM  Result Value Ref Range   Alcohol, Ethyl (B) 442 (HH) <10 mg/dL    Comment: CRITICAL RESULT CALLED TO, READ BACK BY AND VERIFIED WITH  STEVEN JONES AT 0515 01/17/21 SDR (NOTE) Lowest detectable limit for serum alcohol is 10 mg/dL.  For medical purposes only. Performed at [facility], [address] [place], [place] [address]   CBC with Differential     Status: Abnormal   Collection Time: 01/17/21  4:16 AM  Result Value Ref Range   WBC 5.1 4.0 - 10.5 K/uL   RBC 5.10 4.22 - 5.81 MIL/uL  Hemoglobin 15.5 13.0 - 17.0 g/dL   HCT 41.7 40.8 - 14.4 %   MCV 87.5 80.0 - 100.0 fL   MCH 30.4 26.0 - 34.0 pg   MCHC 34.8 30.0 - 36.0 g/dL   RDW 81.8 56.3 - 14.9 %   Platelets 81 (L) 150 - 400 K/uL    Comment: Immature Platelet Fraction may be clinically indicated, consider ordering this additional test [id number]    nRBC 0.0 0.0 - 0.2 %   Neutrophils Relative % 54 %   Neutro Abs 2.7 1.7 - 7.7 K/uL   Lymphocytes Relative 37 %   Lymphs Abs 1.9 0.7 - 4.0 K/uL   Monocytes Relative 5 %   Monocytes Absolute 0.3 0.1 - 1.0 K/uL   Eosinophils Relative 3 %   Eosinophils Absolute 0.2 0.0 - 0.5 K/uL   Basophils Relative 1 %   Basophils Absolute 0.0 0.0 - 0.1 K/uL   WBC  Morphology MORPHOLOGY UNREMARKABLE    RBC Morphology MORPHOLOGY UNREMARKABLE    Smear Review Normal platelet morphology     Comment: PLATELET COUNT CONFIRMED BY SMEAR   Immature Granulocytes 0 %   Abs Immature Granulocytes 0.02 0.00 - 0.07 K/uL    Comment: Performed at [facility], [address] [place], [place] [address]  Comprehensive metabolic panel     Status: Abnormal   Collection Time: 01/17/21  4:16 AM  Result Value Ref Range   Sodium 143 135 - 145 mmol/L   Potassium 2.9 (L) 3.5 - 5.1 mmol/L   Chloride 105 98 - 111 mmol/L   CO2 24 22 - 32 mmol/L   Glucose, Bld 92 70 - 99 mg/dL    Comment: Glucose reference range applies only to samples taken after fasting for at least 8 hours.   BUN 6 6 - 20 mg/dL   Creatinine, Ser 7.74 (L) 0.61 - 1.24 mg/dL   Calcium 8.6 (L) 8.9 - 10.3 mg/dL   Total Protein 6.9 6.5 - 8.1 g/dL   Albumin 4.2 3.5 - 5.0 g/dL   AST 64 (H) 15 - 41 U/L   ALT 45 (H) 0 - 44 U/L   Alkaline Phosphatase 91 38 - 126 U/L   Total Bilirubin 0.6 0.3 - 1.2 mg/dL   GFR, Estimated >12 >87 mL/min    Comment: (NOTE) Calculated using the CKD-EPI Creatinine Equation (2021)    Anion gap 14 5 - 15    Comment: Performed at [facility], [address] [place], [place] [address]  Urine Drug Screen, Qualitative ([facility] only)     Status: None   Collection Time: 01/17/21  4:16 AM  Result Value Ref Range   Tricyclic, Ur Screen NONE DETECTED NONE DETECTED   Amphetamines, Ur Screen NONE DETECTED NONE DETECTED   MDMA (Ecstasy)Ur Screen NONE DETECTED NONE DETECTED   Cocaine Metabolite,Ur [place] NONE DETECTED NONE DETECTED   Opiate, Ur Screen NONE DETECTED NONE DETECTED   Phencyclidine (PCP) Ur S NONE DETECTED NONE DETECTED   Cannabinoid 50 Ng, Ur [place] NONE DETECTED NONE DETECTED   Barbiturates, Ur Screen NONE DETECTED NONE DETECTED   Benzodiazepine, Ur Scrn NONE DETECTED NONE DETECTED   Methadone Scn, Ur NONE DETECTED NONE DETECTED    Comment: (NOTE) Tricyclics +  metabolites, urine    Cutoff 1000 ng/mL Amphetamines + metabolites, urine  Cutoff 1000 ng/mL MDMA (Ecstasy), urine              Cutoff 500 ng/mL Cocaine Metabolite, urine  Cutoff 300 ng/mL Opiate + metabolites, urine        Cutoff 300 ng/mL Phencyclidine (PCP), urine         Cutoff 25 ng/mL Cannabinoid, urine                 Cutoff 50 ng/mL Barbiturates + metabolites, urine  Cutoff 200 ng/mL Benzodiazepine, urine              Cutoff 200 ng/mL Methadone, urine                   Cutoff 300 ng/mL  The urine drug screen provides only a preliminary, unconfirmed analytical test result and should not be used for non-medical purposes. Clinical consideration and professional judgment should be applied to any positive drug screen result due to possible interfering substances. A more specific alternate chemical method must be used in order to obtain a confirmed analytical result. Gas chromatography / mass spectrometry (GC/MS) is the preferred confirm atory method. Performed at [facility], [address] [place], [place] [address]     Current Facility-Administered Medications  Medication Dose Route Frequency Provider Last Rate Last Admin  . chlordiazePOXIDE (LIBRIUM) capsule 50 mg  50 mg Oral TID Nita Sickle, MD   50 mg at 01/17/21 1914   Current Outpatient Medications  Medication Sig Dispense Refill  . amoxicillin-clavulanate (AUGMENTIN) 875-125 MG tablet Take 1 tablet by mouth 2 (two) times daily. 20 tablet 0  . benzonatate (TESSALON) 100 MG capsule Take 1 capsule (100 mg total) by mouth every 8 (eight) hours. 21 capsule 0  . oxyCODONE-acetaminophen (ROXICET) 5-325 MG tablet Take 1 tablet by mouth every 8 (eight) hours as needed. 10 tablet 0    Musculoskeletal: Strength & Muscle Tone: within normal limits Gait & Station: normal Patient leans: N/A            Psychiatric Specialty Exam:  Presentation  General Appearance: No data recorded Eye  Contact:No data recorded Speech:No data recorded Speech Volume:No data recorded Handedness:No data recorded  Mood and Affect  Mood:No data recorded Affect:No data recorded  Thought Process  Thought Processes:No data recorded Descriptions of Associations:No data recorded Orientation:No data recorded Thought Content:No data recorded History of Schizophrenia/Schizoaffective disorder:No data recorded Duration of Psychotic Symptoms:No data recorded Hallucinations:No data recorded Ideas of Reference:No data recorded Suicidal Thoughts:No data recorded Homicidal Thoughts:No data recorded  Sensorium  Memory:No data recorded Judgment:No data recorded Insight:No data recorded  Executive Functions  Concentration:No data recorded Attention Span:No data recorded Recall:No data recorded Fund of Knowledge:No data recorded Language:No data recorded  Psychomotor Activity  Psychomotor Activity:No data recorded  Assets  Assets:No data recorded  Sleep  Sleep:No data recorded  Physical Exam: Physical Exam Vitals and nursing note reviewed.  Constitutional:      Appearance: Normal appearance.  HENT:     Head: Normocephalic and atraumatic.     Mouth/Throat:     Pharynx: Oropharynx is clear.  Eyes:     Pupils: Pupils are equal, round, and reactive to light.  Cardiovascular:     Rate and Rhythm: Normal rate and regular rhythm.  Pulmonary:     Effort: Pulmonary effort is normal.     Breath sounds: Normal breath sounds.  Abdominal:     General: Abdomen is flat.     Palpations: Abdomen is soft.  Musculoskeletal:        General: Normal range of motion.  Skin:    General: Skin is warm and dry.  Neurological:  General: No focal deficit present.     Mental Status: He is alert. Mental status is at baseline.  Psychiatric:        Attention and Perception: Attention normal.        Mood and Affect: Mood normal. Affect is blunt.        Speech: Speech is delayed.        Behavior:  Behavior is slowed.        Thought Content: Thought content normal. Thought content does not include suicidal ideation.        Cognition and Memory: Memory is impaired.    Review of Systems  Constitutional: Negative.   HENT: Negative.   Eyes: Negative.   Respiratory: Negative.   Cardiovascular: Negative.   Gastrointestinal: Negative.   Musculoskeletal: Negative.   Skin: Negative.   Neurological: Negative.   Psychiatric/Behavioral: Positive for substance abuse. Negative for depression, hallucinations and suicidal ideas. The patient is nervous/anxious and has insomnia.    Blood pressure 100/69, pulse 92, temperature 98.8 F (37.1 C), temperature source Oral, resp. rate 18, height 5\' 9"  (1.753 m), weight 68 kg, SpO2 98 %. Body mass index is 22.15 kg/m.  Treatment Plan Summary: Medication management and Plan Patient is on detox orders.  Currently stable.  Drug screen obtained and is all negative.  Primary need is for alcohol withdrawal.  Patient will be referred to residential treatment services as the most appropriate option.  Continue managing and monitoring in the emergency room for now.  Case reviewed with emergency room physician and TTS.  Disposition: Recommend detox treatment  Mordecai RasmussenJohn Britten Seyfried, MD 01/17/2021 12:10 PM

## 2021-01-17 NOTE — ED Notes (Signed)
Pt spoke on phone for approx 10 minutes.

## 2021-01-17 NOTE — ED Provider Notes (Addendum)
[facility] Emergency Department Provider Note  ____________________________________________  Time seen: Approximately 4:27 AM  I have reviewed the triage vital signs and the nursing notes.   HISTORY  Chief Complaint No chief complaint on file.   HPI Jared George is a 45 y.o. male with a history of alcohol abuse who presents to the emergency room requesting detox.  Patient reports that he has been drinking half a gallon of vodka a day for the last 2 years.  Last alcohol intake was just prior to arrival.  Has never been hospitalized for detox in the past.  Has been able to detox on his own.  Denies any other drug use.  He does take Suboxone.  He denies any suicidal homicidal thoughts.  Patient reports that he hates hospital and is only here because his girlfriend made him come.  He is asking help for inpatient detox.  He denies headache, chest pain, shortness of breath, abdominal pain, vomiting, diarrhea.   Past Medical History:  Diagnosis Date  . Alcohol abuse      Prior to Admission medications   Medication Sig Start Date End Date Taking? Authorizing Provider  amoxicillin-clavulanate (AUGMENTIN) 875-125 MG tablet Take 1 tablet by mouth 2 (two) times daily. 02/25/16   Menshew, Charlesetta Ivory, PA-C  benzonatate (TESSALON) 100 MG capsule Take 1 capsule (100 mg total) by mouth every 8 (eight) hours. 07/28/19   Fayrene Helper, PA-C  oxyCODONE-acetaminophen (ROXICET) 5-325 MG tablet Take 1 tablet by mouth every 8 (eight) hours as needed. 02/25/16   Menshew, Charlesetta Ivory, PA-C    Allergies Patient has no known allergies.  No family history on file.  Social History Social History   Tobacco Use  . Smoking status: Heavy Tobacco Smoker  . Smokeless tobacco: Never Used  Substance Use Topics  . Alcohol use: Yes  . Drug use: No    Review of Systems  Constitutional: Negative for fever. Eyes: Negative for visual changes. ENT: Negative for sore  throat. Neck: No neck pain  Cardiovascular: Negative for chest pain. Respiratory: Negative for shortness of breath. Gastrointestinal: Negative for abdominal pain, vomiting or diarrhea. Genitourinary: Negative for dysuria. Musculoskeletal: Negative for back pain. Skin: Negative for rash. Neurological: Negative for headaches, weakness or numbness. Psych: No SI or HI  ____________________________________________   PHYSICAL EXAM:  VITAL SIGNS: ED Triage Vitals  Enc Vitals Group     BP 01/17/21 0342 (!) 142/112     Pulse Rate 01/17/21 0342 (!) 113     Resp 01/17/21 0342 20     Temp 01/17/21 0342 97.8 F (36.6 C)     Temp Source 01/17/21 0342 Oral     SpO2 01/17/21 0342 100 %     Weight 01/17/21 0343 150 lb (68 kg)     Height 01/17/21 0343 5\' 9"  (1.753 m)     Head Circumference --      Peak Flow --      Pain Score 01/17/21 0342 8     Pain Loc --      Pain Edu? --      Excl. in GC? --     Constitutional: Alert and oriented. Well appearing and in no apparent distress. HEENT:      Head: Normocephalic and atraumatic.         Eyes: Conjunctivae are normal. Sclera is non-icteric.       Mouth/Throat: Mucous membranes are moist.       Neck: Supple with no signs  of meningismus. Cardiovascular: Tachycardic with regular rhythm Respiratory: Normal respiratory effort. Lungs are clear to auscultation bilaterally.  Gastrointestinal: Soft, non tender, and non distended with positive bowel sounds. No rebound or guarding. Genitourinary: No CVA tenderness. Musculoskeletal:  No edema, cyanosis, or erythema of extremities. Neurologic: Normal speech and language. Face is symmetric. Moving all extremities. No gross focal neurologic deficits are appreciated. Skin: Skin is warm, dry and intact. No rash noted. Psychiatric: Mood and affect are normal. Speech and behavior are normal.  ____________________________________________   LABS (all labs ordered are listed, but only abnormal results are  displayed)  Labs Reviewed  ETHANOL - Abnormal; Notable for the following components:      Result Value   Alcohol, Ethyl (B) 442 (*)    All other components within normal limits  CBC WITH DIFFERENTIAL/PLATELET - Abnormal; Notable for the following components:   Platelets 81 (*)    All other components within normal limits  COMPREHENSIVE METABOLIC PANEL - Abnormal; Notable for the following components:   Potassium 2.9 (*)    Creatinine, Ser 0.54 (*)    Calcium 8.6 (*)    AST 64 (*)    ALT 45 (*)    All other components within normal limits  URINE DRUG SCREEN, QUALITATIVE ([facility] ONLY)   ____________________________________________  EKG  none  ____________________________________________  RADIOLOGY  none  ____________________________________________   PROCEDURES  Procedure(s) performed: None Procedures Critical Care performed:  None ____________________________________________   INITIAL IMPRESSION / ASSESSMENT AND PLAN / ED COURSE  45 y.o. male with a history of alcohol abuse who presents to the emergency room requesting detox. CIWA of 1 for mild anxiety. Slightly tachycardic and hypertensive. No indication for IVC. Will get labs to rule out any electrolyte derangements, alcoholic ketoacidosis, acute kidney injury or severe dehydration.  We will start patient on the Librium taper.  Will discuss with TTS for inpatient detox as I do not believe patient is safe for outpatient detox considering the large amount of alcohol he consumes.  _________________________ 6:07 AM on 01/17/2021 -----------------------------------------  Alcohol level 442 and patient looks clinically sober.  New thrombocytopenia when compared to labs from 2020 and mildly elevated LFTs both consistent with history of alcohol abuse.  Mild hypokalemia which was supplemented p.o.  Patient is now medically cleared for detox placement.     _____________________________________________ Please note:  Patient  was evaluated in Emergency Department today for the symptoms described in the history of present illness. Patient was evaluated in the context of the global COVID-19 pandemic, which necessitated consideration that the patient might be at risk for infection with the SARS-CoV-2 virus that causes COVID-19. Institutional protocols and algorithms that pertain to the evaluation of patients at risk for COVID-19 are in a state of rapid change based on information released by regulatory bodies including the CDC and federal and state organizations. These policies and algorithms were followed during the patient's care in the ED.  Some ED evaluations and interventions may be delayed as a result of limited staffing during the pandemic.   [place] Controlled Substance Database was reviewed by me. ____________________________________________   FINAL CLINICAL IMPRESSION(S) / ED DIAGNOSES   Final diagnoses:  Alcohol dependence with uncomplicated intoxication (HCC)  Alcohol abuse  Hypokalemia  Alcohol-induced thrombocytopenia (HCC)      NEW MEDICATIONS STARTED DURING THIS VISIT:  ED Discharge Orders    None       Note:  This document was prepared using Dragon voice recognition software and may include unintentional dictation  errors.    Don Perking, [place], MD 01/17/21 0430    Don Perking, [place], MD 01/17/21 [phone]

## 2021-01-17 NOTE — BH Assessment (Signed)
Spoke with RTS Molly Maduro) about the patient, sent information. Received phone call from RTS [place] Citizen) about his medications. Spoke with patient's nurse and she stated the patient no longer wanted to wait and that he was requesting to leave. Informed her of the status with RTS, and she relayed the message to him. Patient continued to voice his desire to leave the ER.  Writer contacted RTS Molly Maduro) and informed them the patient left the ER.

## 2021-01-18 ENCOUNTER — Emergency Department
Admission: EM | Admit: 2021-01-18 | Discharge: 2021-01-19 | Disposition: A | Discharge status: Home / Self Care | Payer: Self-pay | Attending: Emergency Medicine | Admitting: Emergency Medicine

## 2021-01-18 ENCOUNTER — Other Ambulatory Visit: Payer: Self-pay

## 2021-01-18 DIAGNOSIS — E876 Hypokalemia: Secondary | ICD-10-CM | POA: Insufficient documentation

## 2021-01-18 DIAGNOSIS — F1020 Alcohol dependence, uncomplicated: Secondary | ICD-10-CM

## 2021-01-18 DIAGNOSIS — F17200 Nicotine dependence, unspecified, uncomplicated: Secondary | ICD-10-CM | POA: Insufficient documentation

## 2021-01-18 DIAGNOSIS — F10939 Alcohol use, unspecified with withdrawal, unspecified: Secondary | ICD-10-CM | POA: Diagnosis present

## 2021-01-18 DIAGNOSIS — Y908 Blood alcohol level of 240 mg/100 ml or more: Secondary | ICD-10-CM | POA: Insufficient documentation

## 2021-01-18 DIAGNOSIS — F10239 Alcohol dependence with withdrawal, unspecified: Secondary | ICD-10-CM | POA: Diagnosis present

## 2021-01-18 DIAGNOSIS — Z20822 Contact with and (suspected) exposure to covid-19: Secondary | ICD-10-CM | POA: Insufficient documentation

## 2021-01-18 DIAGNOSIS — R45851 Suicidal ideations: Secondary | ICD-10-CM | POA: Insufficient documentation

## 2021-01-18 DIAGNOSIS — F1010 Alcohol abuse, uncomplicated: Secondary | ICD-10-CM | POA: Insufficient documentation

## 2021-01-18 DIAGNOSIS — F10920 Alcohol use, unspecified with intoxication, uncomplicated: Secondary | ICD-10-CM | POA: Diagnosis present

## 2021-01-18 LAB — COMPREHENSIVE METABOLIC PANEL
ALT: 41 U/L (ref 0–44)
AST: 60 U/L — ABNORMAL HIGH (ref 15–41)
Albumin: 4.5 g/dL (ref 3.5–5.0)
Alkaline Phosphatase: 103 U/L (ref 38–126)
Anion gap: 16 — ABNORMAL HIGH (ref 5–15)
BUN: 9 mg/dL (ref 6–20)
CO2: 22 mmol/L (ref 22–32)
Calcium: 9 mg/dL (ref 8.9–10.3)
Chloride: 104 mmol/L (ref 98–111)
Creatinine, Ser: 0.63 mg/dL (ref 0.61–1.24)
GFR, Estimated: >60 mL/min (ref >60)
Glucose, Bld: 110 mg/dL — ABNORMAL HIGH (ref 70–99)
Potassium: 3 mmol/L — ABNORMAL LOW (ref 3.5–5.1)
Sodium: 142 mmol/L (ref 135–145)
Total Bilirubin: 0.8 mg/dL (ref 0.3–1.2)
Total Protein: 7.4 g/dL (ref 6.5–8.1)

## 2021-01-18 LAB — ETHANOL: Alcohol, Ethyl (B): 387 mg/dL (ref <10)

## 2021-01-18 LAB — CBC
HCT: 44.9 % (ref 39.0–52.0)
Hemoglobin: 15.4 g/dL (ref 13.0–17.0)
MCH: 29.9 pg (ref 26.0–34.0)
MCHC: 34.3 g/dL (ref 30.0–36.0)
MCV: 87.2 fL (ref 80.0–100.0)
Platelets: 108 10*3/uL — ABNORMAL LOW (ref 150–400)
RBC: 5.15 MIL/uL (ref 4.22–5.81)
RDW: 13.9 % (ref 11.5–15.5)
WBC: 12.2 10*3/uL — ABNORMAL HIGH (ref 4.0–10.5)
nRBC: 0 % (ref 0.0–0.2)

## 2021-01-18 MED ORDER — LORAZEPAM 2 MG PO TABS
0.0000 mg | ORAL_TABLET | Freq: Four times a day (QID) | ORAL | Status: DC
  Administered 2021-01-19: 2 mg via ORAL
  Filled 2021-01-18: qty 1

## 2021-01-18 MED ORDER — LORAZEPAM 2 MG PO TABS: 0.0000 mg | ORAL_TABLET | Freq: Two times a day (BID) | ORAL | Status: DC

## 2021-01-18 MED ORDER — LORAZEPAM 2 MG/ML IJ SOLN: 0.0000 mg | Freq: Two times a day (BID) | INTRAMUSCULAR | Status: DC

## 2021-01-18 MED ORDER — THIAMINE HCL 100 MG PO TABS
100.0000 mg | ORAL_TABLET | Freq: Every day | ORAL | Status: DC
  Administered 2021-01-19: 100 mg via ORAL
  Filled 2021-01-18: qty 1

## 2021-01-18 MED ORDER — THIAMINE HCL 100 MG/ML IJ SOLN: 100.0000 mg | Freq: Every day | INTRAMUSCULAR | Status: DC

## 2021-01-18 MED ORDER — LORAZEPAM 2 MG/ML IJ SOLN: 0.0000 mg | Freq: Four times a day (QID) | INTRAMUSCULAR | Status: DC

## 2021-01-18 MED ORDER — POTASSIUM CHLORIDE CRYS ER 20 MEQ PO TBCR
40.0000 meq | EXTENDED_RELEASE_TABLET | Freq: Two times a day (BID) | ORAL | Status: DC
  Administered 2021-01-19 (×2): 40 meq via ORAL
  Filled 2021-01-18 (×2): qty 2

## 2021-01-18 NOTE — ED Notes (Addendum)
Patient calm and cooperative. Reported fleeting thoughts of SI and denied HI and present AVH. States that he has periodic AVH, however, he did not elaborate. NAD noted CIWA to be completed and documented. Patient informed of the next steps to take place regarding his care. Verbalized understanding. Will continue to monitor.  Voluntary status.

## 2021-01-18 NOTE — ED Notes (Signed)
Per security and [employer], rn pt came up to them in lobby and endorsed SI and HI. Charge rn notified, pt to triage room 2 to dress out.

## 2021-01-18 NOTE — ED Notes (Signed)
Knife removed and given to pt's spouse to secure in vehicle.

## 2021-01-18 NOTE — ED Notes (Signed)
Pt. Belongings:  [employer] Jeans Black Belt White socks White Shoes Blue Underwear Black Shirt Black Hat [employer] manager of [employer]

## 2021-01-18 NOTE — ED Notes (Signed)
Critical etoh of 387 called from lab. Dr. Delton Prairie notified, charge RN stephen aware as well and working on obtaining a bed for pt at this time.

## 2021-01-18 NOTE — ED Triage Notes (Signed)
Pt states is here for alcohol detox, pt denies SI or HI. Pt is hostile at times, but cooperative in triage. Last ETOH immediately prior to comiing per pt. Pt states drinks a half gallon of vodka daily.

## 2021-01-19 DIAGNOSIS — F1020 Alcohol dependence, uncomplicated: Secondary | ICD-10-CM | POA: Diagnosis present

## 2021-01-19 DIAGNOSIS — F10920 Alcohol use, unspecified with intoxication, uncomplicated: Secondary | ICD-10-CM | POA: Diagnosis present

## 2021-01-19 LAB — URINE DRUG SCREEN, QUALITATIVE (ARMC ONLY)
Amphetamines, Ur Screen: NOT DETECTED
Barbiturates, Ur Screen: NOT DETECTED
Benzodiazepine, Ur Scrn: POSITIVE — AB
Cannabinoid 50 Ng, Ur ~~LOC~~: NOT DETECTED
Cocaine Metabolite,Ur ~~LOC~~: NOT DETECTED
MDMA (Ecstasy)Ur Screen: NOT DETECTED
Methadone Scn, Ur: NOT DETECTED
Opiate, Ur Screen: NOT DETECTED
Phencyclidine (PCP) Ur S: NOT DETECTED
Tricyclic, Ur Screen: NOT DETECTED

## 2021-01-19 LAB — RESP PANEL BY RT-PCR (FLU A&B, COVID) ARPGX2
Influenza A by PCR: NEGATIVE
Influenza B by PCR: NEGATIVE
SARS Coronavirus 2 by RT PCR: NEGATIVE

## 2021-01-19 LAB — MAGNESIUM: Magnesium: 1.9 mg/dL (ref 1.7–2.4)

## 2021-01-19 LAB — ACETAMINOPHEN LEVEL: Acetaminophen (Tylenol), Serum: <10 ug/mL — ABNORMAL LOW (ref 10–30)

## 2021-01-19 LAB — SALICYLATE LEVEL: Salicylate Lvl: <7 mg/dL — ABNORMAL LOW (ref 7.0–30.0)

## 2021-01-19 MED ORDER — CHLORDIAZEPOXIDE HCL 25 MG PO CAPS: 25.0000 mg | ORAL_CAPSULE | Freq: Every day | ORAL | Status: DC

## 2021-01-19 MED ORDER — IBUPROFEN 800 MG PO TABS
800.0000 mg | ORAL_TABLET | Freq: Once | ORAL | Status: AC
  Administered 2021-01-19: 800 mg via ORAL
  Filled 2021-01-19: qty 1

## 2021-01-19 MED ORDER — LOPERAMIDE HCL 2 MG PO CAPS: 2.0000 mg | ORAL_CAPSULE | ORAL | Status: DC | PRN

## 2021-01-19 MED ORDER — THIAMINE HCL 100 MG PO TABS: 100.0000 mg | ORAL_TABLET | Freq: Every day | ORAL | Status: DC

## 2021-01-19 MED ORDER — GABAPENTIN 300 MG PO CAPS: 300.0000 mg | ORAL_CAPSULE | Freq: Four times a day (QID) | ORAL | 0 refills | Status: DC

## 2021-01-19 MED ORDER — ADULT MULTIVITAMIN W/MINERALS CH
1.0000 | ORAL_TABLET | Freq: Every day | ORAL | Status: DC
  Administered 2021-01-19: 1 via ORAL
  Filled 2021-01-19: qty 1

## 2021-01-19 MED ORDER — CHLORDIAZEPOXIDE HCL 25 MG PO CAPS: 25.0000 mg | ORAL_CAPSULE | Freq: Four times a day (QID) | ORAL | Status: DC | PRN

## 2021-01-19 MED ORDER — CHLORDIAZEPOXIDE HCL 25 MG PO CAPS
25.0000 mg | ORAL_CAPSULE | Freq: Four times a day (QID) | ORAL | Status: DC
  Administered 2021-01-19 (×2): 25 mg via ORAL
  Filled 2021-01-19 (×2): qty 1

## 2021-01-19 MED ORDER — GABAPENTIN 300 MG PO CAPS: 300.0000 mg | ORAL_CAPSULE | Freq: Four times a day (QID) | ORAL | Status: DC

## 2021-01-19 MED ORDER — ONDANSETRON 4 MG PO TBDP: 4.0000 mg | ORAL_TABLET | Freq: Four times a day (QID) | ORAL | Status: DC | PRN

## 2021-01-19 MED ORDER — CHLORDIAZEPOXIDE HCL 25 MG PO CAPS: 25.0000 mg | ORAL_CAPSULE | ORAL | Status: DC

## 2021-01-19 MED ORDER — THIAMINE HCL 100 MG/ML IJ SOLN
100.0000 mg | Freq: Once | INTRAMUSCULAR | Status: DC
  Filled 2021-01-19: qty 2

## 2021-01-19 MED ORDER — CHLORDIAZEPOXIDE HCL 25 MG PO CAPS: 25.0000 mg | ORAL_CAPSULE | Freq: Three times a day (TID) | ORAL | Status: DC

## 2021-01-19 MED ORDER — HYDROXYZINE HCL 25 MG PO TABS: 25.0000 mg | ORAL_TABLET | Freq: Four times a day (QID) | ORAL | Status: DC | PRN

## 2021-01-19 MED ORDER — CHLORDIAZEPOXIDE HCL 25 MG PO CAPS
25.0000 mg | ORAL_CAPSULE | Freq: Once | ORAL | Status: AC
  Administered 2021-01-19: 25 mg via ORAL
  Filled 2021-01-19: qty 1

## 2021-01-19 NOTE — ED Notes (Signed)
Patient sleeping in no acute distress. No DTs observed. Will continue to monitor for the remainder of the shift.

## 2021-01-19 NOTE — ED Notes (Signed)
ED Psych Provider at bedside. 

## 2021-01-19 NOTE — Consult Note (Signed)
[facility] Face-to[facility]Face [facility] Consult   Reason for Consult: Psychiatric evaluation Referring Physician: Dr. Elesa MassedWard Patient Identification: Jared BurkittShaun D Shindler MRN:  [id number] Principal Diagnosis: Alcohol use with uncomplicated intoxication (HCC) Diagnosis:  Principal Problem:   Alcohol use with uncomplicated intoxication (HCC) Active Problems:   Alcohol use disorder, severe, dependence (HCC)   Alcohol abuse   Alcohol withdrawal (HCC)   Total Time spent with patient:  20 minutes  Subjective: "I want detox."  Client returned to the ED with alcohol intoxication and requesting detox. He has been drinking 1/2 gallon on liquor daily for two years.  On assessment, he was sweating and complaining of a headache.  He was given Ativan 2 mg about 40 minutes prior with little response.  This provider gave a one time dose of ibuprofen for his headache and one time dose of Librium to evaluate his response as his RN said he requested it specifically.  ALT is 41 WDL and AST 60 H (not too high for Librium).  The client responded well and is currently sleeping with no discomfort noted.  Ativan detox protocol changed to Librium detox protocol. Client denies suicidal ideations and is forward thinking, goal oriented.  IVC resinded as he is not a threat to himself and will facilitate detox placement.    Admission Note from Gillermo MurdochJacqueline Thompson: Jared BurkittShaun D Maturin is a 45 y.o. male patient [facility] via [facility] voluntary.  Per the ED triage nurse note, Pt states is here for alcohol detox, pt denies SI or HI. Pt is hostile at times, but cooperative in triage. Last ETOH immediately prior to comiing per pt. Pt states he drinks a half-gallon of vodka daily.  The patient was seen face-to-face by this provider; the chart was reviewed and consulted with Dr. Elesa MassedWard on 01/19/2021 due to the patient's care. It was discussed with the EDP that the patient does not meet the criteria to be admitted to the psychiatric inpatient unit. The  patient would benefit from a detox treatment program. On the initial check, the patient blood alcohol level was 387 mg/dl.  The patient reiterated that he drinks almost 1/2 gallon of liquor daily and has been doing so for years.  Last drink yesterday.  On evaluation, the patient is alert and oriented x 4, calm and cooperative, and mood-congruent with affect. The patient does not appear to be responding to internal or external stimuli. Neither is the patient presenting with any delusional thinking. The patient denies auditory or visual hallucinations. The patient denies any suicidal, homicidal, or self-harm ideations. The patient is not presenting with any psychotic or paranoid behaviors.   HPI:    Past Psychiatric History:   Risk to Self:   Risk to Others:   Prior Inpatient Therapy:   Prior Outpatient Therapy:    Past Medical History:  Past Medical History:  Diagnosis Date  . Alcohol abuse    No past surgical history on file. Family History: No family history on file. Family Psychiatric  History:  Social History:  Social History   Substance and Sexual Activity  Alcohol Use Yes     Social History   Substance and Sexual Activity  Drug Use No    Social History   Socioeconomic History  . Marital status: Single    Spouse name: Not on file  . Number of children: Not on file  . Years of education: Not on file  . Highest education level: Not on file  Occupational History  . Not on file  Tobacco Use  . Smoking status: Heavy Tobacco Smoker  . Smokeless tobacco: Never Used  Substance and Sexual Activity  . Alcohol use: Yes  . Drug use: No  . Sexual activity: Yes    Birth control/protection: None  Other Topics Concern  . Not on file  Social History Narrative  . Not on file   Social Determinants of Health   Financial Resource Strain: Not on file  Food Insecurity: Not on file  Transportation Needs: Not on file  Physical Activity: Not on file  Stress: Not on file  Social  Connections: Not on file   Additional Social History:    Allergies:  No Known Allergies  Labs:  Results for orders placed or performed during the hospital encounter of 01/18/21 (from the past 48 hour(s))  Comprehensive metabolic panel     Status: Abnormal   Collection Time: 01/18/21 10:00 PM  Result Value Ref Range   Sodium 142 135 - 145 mmol/L   Potassium 3.0 (L) 3.5 - 5.1 mmol/L   Chloride 104 98 - 111 mmol/L   CO2 22 22 - 32 mmol/L   Glucose, Bld 110 (H) 70 - 99 mg/dL    Comment: Glucose reference range applies only to samples taken after fasting for at least 8 hours.   BUN 9 6 - 20 mg/dL   Creatinine, Ser 6.96 0.61 - 1.24 mg/dL   Calcium 9.0 8.9 - 29.5 mg/dL   Total Protein 7.4 6.5 - 8.1 g/dL   Albumin 4.5 3.5 - 5.0 g/dL   AST 60 (H) 15 - 41 U/L   ALT 41 0 - 44 U/L   Alkaline Phosphatase 103 38 - 126 U/L   Total Bilirubin 0.8 0.3 - 1.2 mg/dL   GFR, Estimated >28 >41 mL/min    Comment: (NOTE) Calculated using the CKD-EPI Creatinine Equation (2021)    Anion gap 16 (H) 5 - 15    Comment: Performed at [facility], [address] [place], [place] [address]  Ethanol     Status: Abnormal   Collection Time: 01/18/21 10:00 PM  Result Value Ref Range   Alcohol, Ethyl (B) 387 (HH) <10 mg/dL    Comment: CRITICAL RESULT CALLED TO, READ BACK BY AND VERIFIED WITH APRIL BRUMGARD AT 2241 01/18/21 MF (NOTE) Lowest detectable limit for serum alcohol is 10 mg/dL.  For medical purposes only. Performed at [facility], [address] [place], [place] [address]   cbc     Status: Abnormal   Collection Time: 01/18/21 10:00 PM  Result Value Ref Range   WBC 12.2 (H) 4.0 - 10.5 K/uL   RBC 5.15 4.22 - 5.81 MIL/uL   Hemoglobin 15.4 13.0 - 17.0 g/dL   HCT 53.6 64.4 - 03.4 %   MCV 87.2 80.0 - 100.0 fL   MCH 29.9 26.0 - 34.0 pg   MCHC 34.3 30.0 - 36.0 g/dL   RDW 74.2 59.5 - 63.8 %   Platelets 108 (L) 150 - 400 K/uL    Comment: Immature Platelet Fraction may  be clinically indicated, consider ordering this additional test [id number]    nRBC 0.0 0.0 - 0.2 %    Comment: Performed at [facility], [address], [place], [place] [address]  Magnesium     Status: None   Collection Time: 01/18/21 10:00 PM  Result Value Ref Range   Magnesium 1.9 1.7 - 2.4 mg/dL    Comment: Performed at [facility], [address], [place], [place] [address]  Acetaminophen level     Status: Abnormal   Collection Time: 01/18/21 10:00 PM  Result Value Ref Range   Acetaminophen (Tylenol), Serum <10 (L) 10 - 30 ug/mL    Comment: (NOTE) Therapeutic concentrations vary significantly. A range of 10-30 ug/mL  may be an effective concentration for many patients. However, some  are best treated at concentrations outside of this range. Acetaminophen concentrations >150 ug/mL at 4 hours after ingestion  and >50 ug/mL at 12 hours after ingestion are often associated with  toxic reactions.  Performed at [facility], [address] [place], [place] [address]   Salicylate level     Status: Abnormal   Collection Time: 01/18/21 10:00 PM  Result Value Ref Range   Salicylate Lvl <7.0 (L) 7.0 - 30.0 mg/dL    Comment: Performed at [facility], [address] [place], [place] [address]  Resp Panel by RT-PCR (Flu A&B, Covid) Nasopharyngeal Swab     Status: None   Collection Time: 01/19/21 12:06 AM   Specimen: Nasopharyngeal Swab; Nasopharyngeal(NP) swabs in vial transport medium  Result Value Ref Range   SARS Coronavirus 2 by RT PCR NEGATIVE NEGATIVE    Comment: (NOTE) SARS-CoV-2 target nucleic acids are NOT DETECTED.  The SARS-CoV-2 RNA is generally detectable in upper respiratory specimens during the acute phase of infection. The lowest concentration of SARS-CoV-2 viral copies this assay can detect is 138 copies/mL. A negative result does not preclude SARS-Cov-2 infection and should not be used as the sole basis for treatment  or other patient management decisions. A negative result may occur with  improper specimen collection/handling, submission of specimen other than nasopharyngeal swab, presence of viral mutation(s) within the areas targeted by this assay, and inadequate number of viral copies(<138 copies/mL). A negative result must be combined with clinical observations, patient history, and epidemiological information. The expected result is Negative.  Fact Sheet for Patients:  BloggerCourse.com  Fact Sheet for Healthcare Providers:  SeriousBroker.it  This test is no t yet approved or cleared by the [place] FDA and  has been authorized for detection and/or diagnosis of SARS-CoV-2 by FDA under an Emergency Use Authorization (EUA). This EUA will remain  in effect (meaning this test can be used) for the duration of the COVID-19 declaration under Section 564(b)(1) of the Act, 21 U.S.C.section 360bbb-3(b)(1), unless the authorization is terminated  or revoked sooner.       Influenza A by PCR NEGATIVE NEGATIVE   Influenza B by PCR NEGATIVE NEGATIVE    Comment: (NOTE) The Xpert Xpress SARS-CoV-2/FLU/RSV plus assay is intended as an aid in the diagnosis of influenza from Nasopharyngeal swab specimens and should not be used as a sole basis for treatment. Nasal washings and aspirates are unacceptable for Xpert Xpress SARS-CoV-2/FLU/RSV testing.  Fact Sheet for Patients: BloggerCourse.com  Fact Sheet for Healthcare Providers: SeriousBroker.it  This test is not yet approved or cleared by the [place] FDA and has been authorized for detection and/or diagnosis of SARS-CoV-2 by FDA under an Emergency Use Authorization (EUA). This EUA will remain in effect (meaning this test can be used) for the duration of the COVID-19 declaration under Section 564(b)(1) of the Act, 21 U.S.C. section  360bbb-3(b)(1), unless the authorization is terminated or revoked.  Performed at [facility], [address], [place], [place] [address]   Urine Drug Screen, Qualitative     Status: Abnormal   Collection Time: 01/19/21  4:53 AM  Result Value Ref Range   Tricyclic,  Ur Screen NONE DETECTED NONE DETECTED   Amphetamines, Ur Screen NONE DETECTED NONE DETECTED   MDMA (Ecstasy)Ur Screen NONE DETECTED NONE DETECTED   Cocaine Metabolite,Ur [place] NONE DETECTED NONE DETECTED   Opiate, Ur Screen NONE DETECTED NONE DETECTED   Phencyclidine (PCP) Ur S NONE DETECTED NONE DETECTED   Cannabinoid 50 Ng, Ur [place] NONE DETECTED NONE DETECTED   Barbiturates, Ur Screen NONE DETECTED NONE DETECTED   Benzodiazepine, Ur Scrn POSITIVE (A) NONE DETECTED   Methadone Scn, Ur NONE DETECTED NONE DETECTED    Comment: (NOTE) Tricyclics + metabolites, urine    Cutoff 1000 ng/mL Amphetamines + metabolites, urine  Cutoff 1000 ng/mL MDMA (Ecstasy), urine              Cutoff 500 ng/mL Cocaine Metabolite, urine          Cutoff 300 ng/mL Opiate + metabolites, urine        Cutoff 300 ng/mL Phencyclidine (PCP), urine         Cutoff 25 ng/mL Cannabinoid, urine                 Cutoff 50 ng/mL Barbiturates + metabolites, urine  Cutoff 200 ng/mL Benzodiazepine, urine              Cutoff 200 ng/mL Methadone, urine                   Cutoff 300 ng/mL  The urine drug screen provides only a preliminary, unconfirmed analytical test result and should not be used for non-medical purposes. Clinical consideration and professional judgment should be applied to any positive drug screen result due to possible interfering substances. A more specific alternate chemical method must be used in order to obtain a confirmed analytical result. Gas chromatography / mass spectrometry (GC/MS) is the preferred confirm atory method. Performed at [facility], [address] [place], [place] [address]     Current  Facility-Administered Medications  Medication Dose Route Frequency Provider Last Rate Last Admin  . chlordiazePOXIDE (LIBRIUM) capsule 25 mg  25 mg Oral Q6H PRN Charm Rings, NP      . chlordiazePOXIDE (LIBRIUM) capsule 25 mg  25 mg Oral QID Charm Rings, NP       Followed by  . [START ON 01/20/2021] chlordiazePOXIDE (LIBRIUM) capsule 25 mg  25 mg Oral TID Charm Rings, NP       Followed by  . [START ON 01/21/2021] chlordiazePOXIDE (LIBRIUM) capsule 25 mg  25 mg Oral BH-qamhs Ciclaly Mulcahey, Herminio Heads, NP       Followed by  . [START ON 01/22/2021] chlordiazePOXIDE (LIBRIUM) capsule 25 mg  25 mg Oral Daily Charm Rings, NP      . hydrOXYzine (ATARAX/VISTARIL) tablet 25 mg  25 mg Oral Q6H PRN Charm Rings, NP      . loperamide (IMODIUM) capsule 2-4 mg  2-4 mg Oral PRN Charm Rings, NP      . multivitamin with minerals tablet 1 tablet  1 tablet Oral Daily Paelyn Smick Y, NP      . ondansetron (ZOFRAN-ODT) disintegrating tablet 4 mg  4 mg Oral Q6H PRN Charm Rings, NP      . potassium chloride SA (KLOR-CON) CR tablet 40 mEq  40 mEq Oral BID Ward, Kristen N, DO   40 mEq at 01/19/21 0927  . thiamine (B-1) injection 100 mg  100 mg Intramuscular Once Charm Rings, NP      . Melene Muller ON  01/20/2021] thiamine tablet 100 mg  100 mg Oral Daily Charm Rings, NP       No current outpatient medications on file.    Musculoskeletal: Strength & Muscle Tone: within normal limits Gait & Station: normal Patient leans: N/A  Psychiatric Specialty Exam:  Presentation  General Appearance: Appropriate for Environment  Eye Contact:Good  Speech:Clear and Coherent  Speech Volume:Normal  Handedness:Right  Mood and Affect  Mood:  Anxious, depression  Affect:Appropriate  Thought Process  Thought Processes:Coherent  Descriptions of Associations:Intact  Orientation:Full (Time, Place and Person)  Thought Content:Logical  History of Schizophrenia/Schizoaffective disorder:No  Duration of  Psychotic Symptoms:No data recorded Hallucinations:Hallucinations: None  Ideas of Reference:None  Suicidal Thoughts:Suicidal Thoughts: No  Homicidal Thoughts:Homicidal Thoughts: No  Sensorium  Memory:Immediate Good; Remote Good; Recent Good  Judgment:Good  Insight:Good  Executive Functions  Concentration:Good  Attention Span:Good  Recall:Good  Fund of Knowledge:Good  Language:Good  Psychomotor Activity  Psychomotor Activity:Psychomotor Activity: Normal  Assets  Assets:Resilience; Manufacturing systems engineer; Social Support  Sleep  Sleep:Sleep: Good  Physical Exam: Physical Exam Vitals and nursing note reviewed.  Constitutional:      Appearance: Normal appearance.  HENT:     Nose: Nose normal.     Mouth/Throat:     Mouth: Mucous membranes are dry.  Cardiovascular:     Rate and Rhythm: Tachycardia present.  Pulmonary:     Effort: Pulmonary effort is normal.  Musculoskeletal:        General: Normal range of motion.     Cervical back: Normal range of motion and neck supple.  Neurological:     Mental Status: He is alert and oriented to person, place, and time. Mental status is at baseline.  Psychiatric:        Attention and Perception: Attention and perception normal.        Mood and Affect: Mood is anxious and depressed.        Speech: Speech normal.        Behavior: Behavior normal.        Thought Content: Thought content normal.        Cognition and Memory: Cognition and memory normal.        Judgment: Judgment normal.    Review of Systems  Psychiatric/Behavioral: Positive for depression. The patient is nervous/anxious.   All other systems reviewed and are negative.  Blood pressure 133/82, pulse 94, temperature 98.5 F (36.9 C), temperature source Oral, resp. rate 20, height 5\' 9"  (1.753 m), weight 72.6 kg, SpO2 98 %. Body mass index is 23.63 kg/m.  Treatment Plan Summary: Plan . Medication management.  Primary need is for alcohol withdrawal.  Patient  will be referred to residential treatment services as the most appropriate option.  Continue managing and monitoring in the emergency room for now.  Case reviewed with emergency room physician and TTS. Alcohol use disorder, severe with alcohol withdrawal: -Changed Ativan detox protocol to Librium detox protocol -Admission to a detox facility  Headache: -Ibuprofen 800 mg once for a headache  Disposition: Recommend detox treatment  , NP 01/19/2021 12:09 PM

## 2021-01-19 NOTE — BH Assessment (Signed)
Referral information for Detox treatment faxed to;   . [facility] ([phone]) Staff reports that admission staff are only available in the morning and to contact back.

## 2021-01-19 NOTE — ED Provider Notes (Signed)
[facility] Emergency Department Provider Note  ____________________________________________   Event Date/Time   First MD Initiated Contact with Patient 01/18/21 2304     (approximate)  I have reviewed the triage vital signs and the nursing notes.   HISTORY  Chief Complaint Alcohol Problem    HPI Jared George is a 45 y.o. male with history of alcohol abuse here requesting detox.  Unable to obtain full history from patient given he is very intoxicated, drowsy.  Per triage note, patient stated "fleeting SI" and "periodic AVH".  He denies SI, HI or hallucinations to me at this time but is extremely intoxicated and falls asleep quickly during questioning.  Nods his head when asked if he is here for detox.  No other acute complaints.        Past Medical History:  Diagnosis Date  . Alcohol abuse     Patient Active Problem List   Diagnosis Date Noted  . Alcohol abuse 01/17/2021  . Alcohol withdrawal (HCC) 01/17/2021    No past surgical history on file.  Prior to Admission medications   Not on File    Allergies Patient has no known allergies.  No family history on file.  Social History Social History   Tobacco Use  . Smoking status: Heavy Tobacco Smoker  . Smokeless tobacco: Never Used  Substance Use Topics  . Alcohol use: Yes  . Drug use: No    Review of Systems Level 5 caveat secondary to intoxication  ____________________________________________   PHYSICAL EXAM:  VITAL SIGNS: ED Triage Vitals  Enc Vitals Group     BP 01/18/21 2157 (!) 146/106     Pulse Rate 01/18/21 2155 (!) 122     Resp 01/18/21 2155 16     Temp 01/18/21 2155 97.8 F (36.6 C)     Temp Source 01/18/21 2155 Oral     SpO2 01/18/21 2155 100 %     Weight 01/18/21 2155 160 lb (72.6 kg)     Height 01/18/21 2155 5\' 9"  (1.753 m)     Head Circumference --      Peak Flow --      Pain Score --      Pain Loc --      Pain Edu? --      Excl. in GC? --     CONSTITUTIONAL: Alert, intoxicated, drowsy, arouses to voice but then falls asleep quickly HEAD: Normocephalic EYES: Conjunctivae clear, pupils appear equal, EOM appear intact ENT: normal nose; moist mucous membranes NECK: Supple, normal ROM CARD: regular and tachycardia; S1 and S2 appreciated; no murmurs, no clicks, no rubs, no gallops RESP: Normal chest excursion without splinting or tachypnea; breath sounds clear and equal bilaterally; no wheezes, no rhonchi, no rales, no hypoxia or respiratory distress, speaking full sentences ABD/GI: Normal bowel sounds; non-distended; soft, non-tender, no rebound, no guarding, no peritoneal signs, no hepatosplenomegaly BACK: The back appears normal EXT: Normal ROM in all joints; no deformity noted, no edema; no cyanosis SKIN: Normal color for age and race; warm; no rash on exposed skin NEURO: Moves all extremities equally PSYCH: Denies SI, HI, hallucinations at this time but is intoxicated.  ____________________________________________   LABS (all labs ordered are listed, but only abnormal results are displayed)  Labs Reviewed  COMPREHENSIVE METABOLIC PANEL - Abnormal; Notable for the following components:      Result Value   Potassium 3.0 (*)    Glucose, Bld 110 (*)    AST 60 (*)  Anion gap 16 (*)    All other components within normal limits  ETHANOL - Abnormal; Notable for the following components:   Alcohol, Ethyl (B) 387 (*)    All other components within normal limits  CBC - Abnormal; Notable for the following components:   WBC 12.2 (*)    Platelets 108 (*)    All other components within normal limits  ACETAMINOPHEN LEVEL - Abnormal; Notable for the following components:   Acetaminophen (Tylenol), Serum <10 (*)    All other components within normal limits  SALICYLATE LEVEL - Abnormal; Notable for the following components:   Salicylate Lvl <7.0 (*)    All other components within normal limits  RESP PANEL BY RT-PCR (FLU A&B,  COVID) ARPGX2  MAGNESIUM  URINE DRUG SCREEN, QUALITATIVE ([facility] ONLY)   ____________________________________________  EKG   ____________________________________________  RADIOLOGY I, Tai Syfert, personally viewed and evaluated these images (plain radiographs) as part of my medical decision making, as well as reviewing the written report by the radiologist.  ED MD interpretation:    Official radiology report(s): No results found.  ____________________________________________   PROCEDURES  Procedure(s) performed (including Critical Care):  Procedures    ____________________________________________   INITIAL IMPRESSION / ASSESSMENT AND PLAN / ED COURSE  As part of my medical decision making, I reviewed the following data within the electronic MEDICAL RECORD NUMBER Nursing notes reviewed and incorporated, Labs reviewed , Old chart reviewed, Notes from prior ED visits and [place] Controlled Substance Database         Patient here with alcohol abuse requesting detox.  Also reported SI, HI and hallucinations to multiple staff members in triage.  He denies this to me at this time but is extremely intoxicated and falls asleep quickly during questioning.  Screening labs, urine, COVID ordered.  Will consult TTS, psychiatry.  Given concerns for complaints of SI, HI and hallucinations, have placed him under full IVC.  Labs show significantly elevated alcohol level of 387.  Is hypokalemic.  Will give oral replacement.  Magnesium level normal.  ED PROGRESS  Patient seen by psych NP and TTS overnight.  They feel he is psychiatrically clear however I am concerned that he expressed to multiple staff members SI, HI and hallucinations.  I will keep him under full IVC at this time and psych can reassess in the morning to determine if they would like to rescind his IVC.  They recommend detox for this patient.  Referral sent to [facility].   I reviewed all nursing notes and pertinent previous records as  available.  I have reviewed and interpreted any EKGs, lab and urine results, imaging (as available).  ____________________________________________   FINAL CLINICAL IMPRESSION(S) / ED DIAGNOSES  Final diagnoses:  Alcohol abuse  Suicidal ideation  Hypokalemia     ED Discharge Orders    None      *Please note:  Jared George was evaluated in Emergency Department on 01/19/2021 for the symptoms described in the history of present illness. He was evaluated in the context of the global COVID-19 pandemic, which necessitated consideration that the patient might be at risk for infection with the SARS-CoV-2 virus that causes COVID-19. Institutional protocols and algorithms that pertain to the evaluation of patients at risk for COVID-19 are in a state of rapid change based on information released by regulatory bodies including the CDC and federal and state organizations. These policies and algorithms were followed during the patient's care in the ED.  Some ED evaluations and interventions  may be delayed as a result of limited staffing during and the pandemic.*   Note:  This document was prepared using Dragon voice recognition software and may include unintentional dictation errors.   Magdaline Zollars, Layla Maw, DO 01/19/21 [phone]

## 2021-01-19 NOTE — BH Assessment (Signed)
Patient states he want to leave and no longer want to wait on a bed for detox.  Writer provided the patient with information and instructions on how to access [facility]patient [facility] ([facility] [employer]). As well as provided him with [facility] Peer Support and received permission to give peer support his contact information.  Patient denies SI/HI and AV/H.   __________ [facility] [address],  [place], [place] [address] [phone]  [facility] [address],  [place], [place] [address] [phone]

## 2021-01-19 NOTE — BH Assessment (Signed)
Comprehensive Clinical Assessment (CCA) Note  01/19/2021 Jared George [id number]  Chief Complaint: Patient is a 45 year old male presenting to [facility] ED seeking detox treatment. During assessment patient appears alert and oriented x4, calm and cooperative. Patient current BAL is 387. Patient drinks a half gallon of vodka daily, patient's last drink was 01/18/21 prior to coming to the ED. Patient appeared in this ED on 01/17/21 requesting detox treatment but ultimately left, patient reports that he is now motivated to get treatment. Patient denies SI/HI/AH/VH  Per Psyc NP Elenore Paddy patient is recommended for detox treatment Chief Complaint  Patient presents with  . Alcohol Problem   Visit Diagnosis: Alcohol Use Disorder, Severe   CCA Screening, Triage and Referral (STR)  Patient Reported Information How did you hear about [place]? Family/Friend  Referral name: No data recorded Referral phone number: No data recorded  Whom do you see for routine medical problems? Other (Comment)  Practice/Facility Name: No data recorded Practice/Facility Phone Number: No data recorded Name of Contact: No data recorded Contact Number: No data recorded Contact Fax Number: No data recorded Prescriber Name: No data recorded Prescriber Address (if known): No data recorded  What Is the Reason for Your Visit/Call Today? No data recorded How Long Has This Been Causing You Problems? > than 6 months  What Do You Feel Would Help You the Most Today? Alcohol or Drug Use Treatment   Have You Recently Been in Any Inpatient Treatment (Hospital/Detox/Crisis Center/28-Day Program)? No  Name/Location of Program/Hospital:No data recorded How Long Were You There? No data recorded When Were You Discharged? No data recorded  Have You Ever Received Services From [facility] Before? No  Who Do You See at [facility]? No data recorded  Have You Recently Had Any Thoughts About Hurting Yourself? No  Are You  Planning to Commit Suicide/Harm Yourself At This time? No   Have you Recently Had Thoughts About Hurting Someone Karolee Ohs? No  Explanation: No data recorded  Have You Used Any Alcohol or Drugs in the Past 24 Hours? Yes  How Long Ago Did You Use Drugs or Alcohol? No data recorded What Did You Use and How Much? Alcohol   Do You Currently Have a Therapist/Psychiatrist? No  Name of Therapist/Psychiatrist: No data recorded  Have You Been Recently Discharged From Any Office Practice or Programs? No  Explanation of Discharge From Practice/Program: No data recorded    CCA Screening Triage Referral Assessment Type of Contact: Face-to-Face  Is this Initial or Reassessment? No data recorded Date Telepsych consult ordered in [facility]:  No data recorded Time Telepsych consult ordered in [facility]:  No data recorded  Patient Reported Information Reviewed? Yes  Patient Left Without Being Seen? No data recorded Reason for Not Completing Assessment: No data recorded  Collateral Involvement: No data recorded  Does Patient Have a Court Appointed Legal Guardian? No data recorded Name and Contact of Legal Guardian: No data recorded If Minor and Not Living with Parent(s), Who has Custody? No data recorded Is CPS involved or ever been involved? Never  Is APS involved or ever been involved? Never   Patient Determined To Be At Risk for Harm To Self or Others Based on Review of Patient Reported Information or Presenting Complaint? No  Method: No data recorded Availability of Means: No data recorded Intent: No data recorded Notification Required: No data recorded Additional Information for Danger to Others Potential: No data recorded Additional Comments for Danger to Others Potential: No data recorded Are  There Guns or Other Weapons in Your Home? No data recorded Types of Guns/Weapons: No data recorded Are These Weapons Safely Secured?                            No data recorded Who Could Verify You Are  Able To Have These Secured: No data recorded Do You Have any Outstanding Charges, Pending Court Dates, Parole/Probation? No data recorded Contacted To Inform of Risk of Harm To Self or Others: No data recorded  Location of Assessment: [facility] ED   Does Patient Present under Involuntary Commitment? No data recorded IVC Papers Initial File Date: No data recorded  [place] of Residence: [place]   Patient Currently Receiving the Following Services: No data recorded  Determination of Need: Emergent (2 hours)   Options For Referral: No data recorded    CCA Biopsychosocial Intake/Chief Complaint:  Patient is presenting requesting detox treatment  Current Symptoms/Problems: Patient is presenting requesting detox treatment   Patient Reported Schizophrenia/Schizoaffective Diagnosis in Past: No   Strengths: Patient is able to communicate  Preferences: Unknown  Abilities: Patient is able to communicate   Type of Services Patient Feels are Needed: Detox treatment   Initial Clinical Notes/Concerns: None   Mental Health Symptoms Depression:  None   Duration of Depressive symptoms: No data recorded  Mania:  None   Anxiety:   None   Psychosis:  None   Duration of Psychotic symptoms: No data recorded  Trauma:  N/A   Obsessions:  None   Compulsions:  None   Inattention:  None   Hyperactivity/Impulsivity:  N/A   Oppositional/Defiant Behaviors:  None   Emotional Irregularity:  None   Other Mood/Personality Symptoms:  No data recorded   Mental Status Exam Appearance and self-care  Stature:  Average   Weight:  Average weight   Clothing:  Casual   Grooming:  Normal   Cosmetic use:  None   Posture/gait:  Normal   Motor activity:  Not Remarkable   Sensorium  Attention:  Normal   Concentration:  Normal   Orientation:  X5   Recall/memory:  Normal   Affect and Mood  Affect:  Appropriate   Mood:  Irritable   Relating  Eye contact:  Normal   Facial  expression:  Responsive   Attitude toward examiner:  Cooperative   Thought and Language  Speech flow: Clear and Coherent   Thought content:  Appropriate to Mood and Circumstances   Preoccupation:  None   Hallucinations:  None   Organization:  No data recorded  [employer] of Knowledge:  Fair   Intelligence:  Average   Abstraction:  Normal   Judgement:  Fair   Dance movement psychotherapist:  Realistic   Insight:  Poor   Decision Making:  Normal   Social Functioning  Social Maturity:  Irresponsible   Social Judgement:  Heedless   Stress  Stressors:  Relationship   Coping Ability:  Contractor Deficits:  None   Supports:  Family     Religion: Religion/Spirituality Are You A Religious Person?: No  Leisure/Recreation: Leisure / Recreation Do You Have Hobbies?: No  Exercise/Diet: Exercise/Diet Do You Exercise?: No Have You Gained or Lost A Significant Amount of Weight in the Past Six Months?: No Do You Follow a Special Diet?: No Do You Have Any Trouble Sleeping?: No   CCA Employment/Education Employment/Work Situation: Employment / Work Situation Employment situation: Unemployed  Education: Education Is  Patient Currently Attending School?: No Did You Have An Individualized Education Program (IIEP): No Did You Have Any Difficulty At School?: No Patient's Education Has Been Impacted by Current Illness: No   CCA Family/Childhood History Family and Relationship History: Family history Marital status: Single What is your sexual orientation?: Heterosexual Has your sexual activity been affected by drugs, alcohol, medication, or emotional stress?: Unknown  Childhood History:  Childhood History Additional childhood history information: None reported Description of patient's relationship with caregiver when they were a child: None reported Patient's description of current relationship with people who raised him/her: None reported How were  you disciplined when you got in trouble as a child/adolescent?: None reported Does patient have siblings?: No Did patient suffer any verbal/emotional/physical/sexual abuse as a child?: No Did patient suffer from severe childhood neglect?: No Has patient ever been sexually abused/assaulted/raped as an adolescent or adult?: No Was the patient ever a victim of a crime or a disaster?: No Witnessed domestic violence?: No Has patient been affected by domestic violence as an adult?: No  Child/Adolescent Assessment:     CCA Substance Use Alcohol/Drug Use: Alcohol / Drug Use Pain Medications: See MAR Prescriptions: See MAR Over the Counter: See MAR History of alcohol / drug use?: Yes Withdrawal Symptoms: Agitation Substance #1 Name of Substance 1: Alcohol 1 - Amount (size/oz): 1/2 gallon 1 - Frequency: daily                       ASAM's:  Six Dimensions of Multidimensional Assessment  Dimension 1:  Acute Intoxication and/or Withdrawal Potential:      Dimension 2:  Biomedical Conditions and Complications:      Dimension 3:  Emotional, Behavioral, or Cognitive Conditions and Complications:     Dimension 4:  Readiness to Change:     Dimension 5:  Relapse, Continued use, or Continued Problem Potential:     Dimension 6:  Recovery/Living Environment:     ASAM Severity Score:    ASAM Recommended Level of Treatment:     Substance use Disorder (SUD) Substance Use Disorder (SUD)  Checklist Symptoms of Substance Use: Continued use despite having a persistent/recurrent physical/psychological problem caused/exacerbated by use,Evidence of tolerance,Large amounts of time spent to obtain, use or recover from the substance(s),Continued use despite persistent or recurrent social, interpersonal problems, caused or exacerbated by use,Persistent desire or unsuccessful efforts to cut down or control use,Repeated use in physically hazardous situations,Recurrent use that results in a failure to  fulfill major role obligations (work, school, home),Presence of craving or strong urge to use,Social, occupational, recreational activities given up or reduced due to use,Substance(s) often taken in larger amounts or over longer times than was intended  Recommendations for Services/Supports/Treatments: Recommendations for Services/Supports/Treatments Recommendations For Services/Supports/Treatments: Detox  DSM5 Diagnoses: Patient Active Problem List   Diagnosis Date Noted  . Alcohol abuse 01/17/2021  . Alcohol withdrawal (HCC) 01/17/2021    Patient Centered Plan: Patient is on the following Treatment Plan(s):  Substance Abuse   Referrals to Alternative Service(s): Referred to Alternative Service(s):   Place:   Date:   Time:    Referred to Alternative Service(s):   Place:   Date:   Time:    Referred to Alternative Service(s):   Place:   Date:   Time:    Referred to Alternative Service(s):   Place:   Date:   Time:     Braidyn Peace A Chanley Mcenery, LCAS-A

## 2021-01-19 NOTE — ED Notes (Signed)
Pt discharged home. Refused VS.  Discahrge papers reviewed with patient. Pt denies SI. All belongings returned to patient.

## 2021-01-19 NOTE — BH Assessment (Addendum)
Referral information for Psychiatric Hospitalization faxed to;   . [facility] ([phone]), no beds  . [facility] [phone]), unable to reach anyone.  [facility] [phone]), unable to reach anyone.  [facility] [phone]), unable to reach anyone.  . [facility] [phone]), No male beds.  . [facility] [phone] or [phone]), left a HIPPA compliant voicemail message, requesting a return phone call.  [facility] ([phone]), pending review.  [facility] [phone] -[phone]), Pending review.  [facility] [phone]), left a HIPPA compliant voicemail message requesting a return phone call.

## 2021-01-19 NOTE — Consult Note (Signed)
[facility] Face-to-Face Psychiatry Consult   Reason for Consult: Psychiatric evaluation Referring Physician: Dr. Elesa Massed Patient Identification: Jared George MRN:  [id number] Principal Diagnosis: <principal problem not specified> Diagnosis:  Active Problems:   Alcohol abuse   Alcohol withdrawal (HCC)   Total Time spent with patient:      Subjective: "I want help." Jared George is a 45 y.o. male patient presented to [facility] ED via POV voluntary.  Per the ED triage nurse note, Pt states is here for alcohol detox, pt denies SI or HI. Pt is hostile at times, but cooperative in triage. Last ETOH immediately prior to comiing per pt. Pt states he drinks a half-gallon of vodka daily.  The patient was seen face-to-face by this provider; the chart was reviewed and consulted with Dr. Elesa Massed on 01/19/2021 due to the patient's care. It was discussed with the EDP that the patient does not meet the criteria to be admitted to the psychiatric inpatient unit. The patient would benefit from a detox treatment program. On the initial check, the patient blood alcohol level was 387 mg/dl.  The patient reiterated that he drinks almost 1/2 gallon of liquor daily and has been doing so for years.  Last drink yesterday.  On evaluation, the patient is alert and oriented x 4, calm and cooperative, and mood-congruent with affect. The patient does not appear to be responding to internal or external stimuli. Neither is the patient presenting with any delusional thinking. The patient denies auditory or visual hallucinations. The patient denies any suicidal, homicidal, or self-harm ideations. The patient is not presenting with any psychotic or paranoid behaviors.   HPI:    Past Psychiatric History:   Risk to Self:   Risk to Others:   Prior Inpatient Therapy:   Prior Outpatient Therapy:    Past Medical History:  Past Medical History:  Diagnosis Date  . Alcohol abuse    No past surgical history on file. Family History: No family  history on file. Family Psychiatric  History:  Social History:  Social History   Substance and Sexual Activity  Alcohol Use Yes     Social History   Substance and Sexual Activity  Drug Use No    Social History   Socioeconomic History  . Marital status: Single    Spouse name: Not on file  . Number of children: Not on file  . Years of education: Not on file  . Highest education level: Not on file  Occupational History  . Not on file  Tobacco Use  . Smoking status: Heavy Tobacco Smoker  . Smokeless tobacco: Never Used  Substance and Sexual Activity  . Alcohol use: Yes  . Drug use: No  . Sexual activity: Yes    Birth control/protection: None  Other Topics Concern  . Not on file  Social History Narrative  . Not on file   Social Determinants of Health   Financial Resource Strain: Not on file  Food Insecurity: Not on file  Transportation Needs: Not on file  Physical Activity: Not on file  Stress: Not on file  Social Connections: Not on file   Additional Social History:    Allergies:  No Known Allergies  Labs:  Results for orders placed or performed during the hospital encounter of 01/18/21 (from the past 48 hour(s))  Comprehensive metabolic panel     Status: Abnormal   Collection Time: 01/18/21 10:00 PM  Result Value Ref Range   Sodium 142 135 - 145 mmol/L   Potassium  3.0 (L) 3.5 - 5.1 mmol/L   Chloride 104 98 - 111 mmol/L   CO2 22 22 - 32 mmol/L   Glucose, Bld 110 (H) 70 - 99 mg/dL    Comment: Glucose reference range applies only to samples taken after fasting for at least 8 hours.   BUN 9 6 - 20 mg/dL   Creatinine, Ser 5.02 0.61 - 1.24 mg/dL   Calcium 9.0 8.9 - 77.4 mg/dL   Total Protein 7.4 6.5 - 8.1 g/dL   Albumin 4.5 3.5 - 5.0 g/dL   AST 60 (H) 15 - 41 U/L   ALT 41 0 - 44 U/L   Alkaline Phosphatase 103 38 - 126 U/L   Total Bilirubin 0.8 0.3 - 1.2 mg/dL   GFR, Estimated >12 >87 mL/min    Comment: (NOTE) Calculated using the CKD-EPI Creatinine  Equation (2021)    Anion gap 16 (H) 5 - 15    Comment: Performed at [facility], [address] [place], [place] [address]  Ethanol     Status: Abnormal   Collection Time: 01/18/21 10:00 PM  Result Value Ref Range   Alcohol, Ethyl (B) 387 (HH) <10 mg/dL    Comment: CRITICAL RESULT CALLED TO, READ BACK BY AND VERIFIED WITH APRIL BRUMGARD AT 2241 01/18/21 MF (NOTE) Lowest detectable limit for serum alcohol is 10 mg/dL.  For medical purposes only. Performed at [facility], [address] [place], [place] [address]   cbc     Status: Abnormal   Collection Time: 01/18/21 10:00 PM  Result Value Ref Range   WBC 12.2 (H) 4.0 - 10.5 K/uL   RBC 5.15 4.22 - 5.81 MIL/uL   Hemoglobin 15.4 13.0 - 17.0 g/dL   HCT 09.6 28.3 - 66.2 %   MCV 87.2 80.0 - 100.0 fL   MCH 29.9 26.0 - 34.0 pg   MCHC 34.3 30.0 - 36.0 g/dL   RDW 94.7 65.4 - 65.0 %   Platelets 108 (L) 150 - 400 K/uL    Comment: Immature Platelet Fraction may be clinically indicated, consider ordering this additional test [id number]    nRBC 0.0 0.0 - 0.2 %    Comment: Performed at [facility], [address], [place], [place] [address]  Magnesium     Status: None   Collection Time: 01/18/21 10:00 PM  Result Value Ref Range   Magnesium 1.9 1.7 - 2.4 mg/dL    Comment: Performed at [facility], [address] [place], [place] [address]  Acetaminophen level     Status: Abnormal   Collection Time: 01/18/21 10:00 PM  Result Value Ref Range   Acetaminophen (Tylenol), Serum <10 (L) 10 - 30 ug/mL    Comment: (NOTE) Therapeutic concentrations vary significantly. A range of 10-30 ug/mL  may be an effective concentration for many patients. However, some  are best treated at concentrations outside of this range. Acetaminophen concentrations >150 ug/mL at 4 hours after ingestion  and >50 ug/mL at 12 hours after ingestion are often associated with  toxic reactions.  Performed at [facility], [address] [place], [place] [address]   Salicylate level     Status: Abnormal   Collection Time: 01/18/21 10:00 PM  Result Value Ref Range   Salicylate Lvl <7.0 (L) 7.0 - 30.0 mg/dL    Comment: Performed at [facility], [address] [place], [place] [address]  Resp Panel by RT-PCR (Flu A&B, Covid) Nasopharyngeal Swab     Status:  None   Collection Time: 01/19/21 12:06 AM   Specimen: Nasopharyngeal Swab; Nasopharyngeal(NP) swabs in vial transport medium  Result Value Ref Range   SARS Coronavirus 2 by RT PCR NEGATIVE NEGATIVE    Comment: (NOTE) SARS-CoV-2 target nucleic acids are NOT DETECTED.  The SARS-CoV-2 RNA is generally detectable in upper respiratory specimens during the acute phase of infection. The lowest concentration of SARS-CoV-2 viral copies this assay can detect is 138 copies/mL. A negative result does not preclude SARS-Cov-2 infection and should not be used as the sole basis for treatment or other patient management decisions. A negative result may occur with  improper specimen collection/handling, submission of specimen other than nasopharyngeal swab, presence of viral mutation(s) within the areas targeted by this assay, and inadequate number of viral copies(<138 copies/mL). A negative result must be combined with clinical observations, patient history, and epidemiological information. The expected result is Negative.  Fact Sheet for Patients:  BloggerCourse.com  Fact Sheet for Healthcare Providers:  SeriousBroker.it  This test is no t yet approved or cleared by the [place] FDA and  has been authorized for detection and/or diagnosis of SARS-CoV-2 by FDA under an Emergency Use Authorization (EUA). This EUA will remain  in effect (meaning this test can be used) for the duration of the COVID-19 declaration under Section 564(b)(1) of the Act, 21 U.S.C.section 360bbb-3(b)(1), unless the  authorization is terminated  or revoked sooner.       Influenza A by PCR NEGATIVE NEGATIVE   Influenza B by PCR NEGATIVE NEGATIVE    Comment: (NOTE) The Xpert Xpress SARS-CoV-2/FLU/RSV plus assay is intended as an aid in the diagnosis of influenza from Nasopharyngeal swab specimens and should not be used as a sole basis for treatment. Nasal washings and aspirates are unacceptable for Xpert Xpress SARS-CoV-2/FLU/RSV testing.  Fact Sheet for Patients: BloggerCourse.com  Fact Sheet for Healthcare Providers: SeriousBroker.it  This test is not yet approved or cleared by the [place] FDA and has been authorized for detection and/or diagnosis of SARS-CoV-2 by FDA under an Emergency Use Authorization (EUA). This EUA will remain in effect (meaning this test can be used) for the duration of the COVID-19 declaration under Section 564(b)(1) of the Act, 21 U.S.C. section 360bbb-3(b)(1), unless the authorization is terminated or revoked.  Performed at [facility], [address], [place], [place] [address]     Current Facility-Administered Medications  Medication Dose Route Frequency Provider Last Rate Last Admin  . LORazepam (ATIVAN) injection 0-4 mg  0-4 mg Intravenous Q6H Ward, Kristen N, DO       Or  . LORazepam (ATIVAN) tablet 0-4 mg  0-4 mg Oral Q6H Ward, Kristen N, DO      . [START ON 01/21/2021] LORazepam (ATIVAN) injection 0-4 mg  0-4 mg Intravenous Q12H Ward, Kristen N, DO       Or  . Melene Muller ON 01/21/2021] LORazepam (ATIVAN) tablet 0-4 mg  0-4 mg Oral Q12H Ward, Kristen N, DO      . potassium chloride SA (KLOR-CON) CR tablet 40 mEq  40 mEq Oral BID Ward, Kristen N, DO   40 mEq at 01/19/21 0010  . thiamine tablet 100 mg  100 mg Oral Daily Ward, Kristen N, DO       Or  . thiamine (B-1) injection 100 mg  100 mg Intravenous Daily Ward, Kristen N, DO       No current outpatient medications on file.     Musculoskeletal: Strength & Muscle Tone: within normal  limits Gait & Station: normal Patient leans: N/A  Psychiatric Specialty Exam:  Presentation  General Appearance: Appropriate for Environment  Eye Contact:Good  Speech:Clear and Coherent  Speech Volume:Normal  Handedness:Right   Mood and Affect  Mood:Euthymic  Affect:Appropriate   Thought Process  Thought Processes:Coherent  Descriptions of Associations:Intact  Orientation:Full (Time, Place and Person)  Thought Content:Logical  History of Schizophrenia/Schizoaffective disorder:No data recorded Duration of Psychotic Symptoms:No data recorded Hallucinations:Hallucinations: None  Ideas of Reference:None  Suicidal Thoughts:Suicidal Thoughts: No  Homicidal Thoughts:Homicidal Thoughts: No   Sensorium  Memory:Immediate Good; Remote Good; Recent Good  Judgment:Good  Insight:Good   Executive Functions  Concentration:Good  Attention Span:Good  Recall:Good  Fund of Knowledge:Good  Language:Good   Psychomotor Activity  Psychomotor Activity:Psychomotor Activity: Normal   Assets  Assets:Resilience; Manufacturing systems engineerCommunication Skills; Social Support   Sleep  Sleep:Sleep: Good   Physical Exam: Physical Exam Vitals and nursing note reviewed.  Constitutional:      Appearance: Normal appearance.  HENT:     Nose: Nose normal.     Mouth/Throat:     Mouth: Mucous membranes are dry.  Cardiovascular:     Rate and Rhythm: Tachycardia present.  Pulmonary:     Effort: Pulmonary effort is normal.  Musculoskeletal:        General: Normal range of motion.     Cervical back: Normal range of motion and neck supple.  Neurological:     Mental Status: He is alert and oriented to person, place, and time. Mental status is at baseline.  Psychiatric:        Mood and Affect: Mood normal.        Behavior: Behavior normal.    ROS Blood pressure (!) 146/106, pulse (!) 122, temperature 97.8 F (36.6 C),  temperature source Oral, resp. rate 16, height 5\' 9"  (1.753 m), weight 72.6 kg, SpO2 100 %. Body mass index is 23.63 kg/m.  Treatment Plan Summary: Plan . Medication management.  Primary need is for alcohol withdrawal.  Patient will be referred to residential treatment services as the most appropriate option.  Continue managing and monitoring in the emergency room for now.  Case reviewed with emergency room physician and TTS.  Disposition: Recommend detox treatment  Gillermo MurdochJacqueline Richar Dunklee, NP 01/19/2021 2:24 AM

## 2021-01-19 NOTE — ED Notes (Signed)
Pt sleeping soundly. Medications will be given when pt is fully awake and take pills safely.

## 2021-01-19 NOTE — ED Notes (Signed)
Lunch tray placed in room. 

## 2021-01-19 NOTE — ED Notes (Addendum)
Patients wife contacted this Clinical research associate X 2 in a matter of about 30 mins apart. She stated that the patient is here for help and that she is worried he will hurt staff or vice versa if someone wakes him up out of his sleep. She said that he is a monster and will tear things up. She also stated that numerous people have to hold him down once he gets in a rage and that he cannot be controlled. She then stated that he will vomit in his sleep and states that staff are to put him on his side if and when this occurs. The wife was told numerous times that the patient is in good care and that we are suited and capable of taking care of her husband in a safe and therapeutic manner. The wife called twice with the same concerns and on the second call, she asked if she could talk to him (despite being told he was sound asleep). This Clinical research associate then informed her, no, that speaking to him is not an option at this point based on her describing how he presents when awakened. It is also past the allotted times for phone calls. Wife ensured multiple times by this Clinical research associate that her husband is in the hands of professional suitable staff. She did not appear to understand nor sound relieved. She asked if she could get a call back and she was informed that if time allowed it could be possible someone could reach out to her. She verbalized understanding.

## 2021-01-19 NOTE — BH Assessment (Addendum)
Writer spoke with the patient to complete an updated/reassessment. Patient denies SI/HI and AV/H. Continues to voice his desire for alcohol detox and Treatment.

## 2021-01-19 NOTE — ED Notes (Signed)
IVC rescinded by Celso Amy, NP. Patient is VOL pending placement

## 2021-01-19 NOTE — Consult Note (Signed)
Client requested to leave to go home and smoke, wants to follow up at [facility] for assistance.  No suicidal ideations and his significant other will come and pick him up.  Rx for gabapentin provided to assist for his detox.  Advised him to return if his withdrawal symptoms increased and educated him on withdrawal seizures. Also encouraged AA meetings with a sponsor.  Pleasant, grateful, and resources provided by [facility].  Nanine Means, PMHNP

## 2021-02-06 ENCOUNTER — Emergency Department
Admission: EM | Admit: 2021-02-06 | Discharge: 2021-02-06 | Disposition: A | Discharge status: Home / Self Care | Payer: Self-pay | Attending: Emergency Medicine | Admitting: Emergency Medicine

## 2021-02-06 ENCOUNTER — Other Ambulatory Visit: Payer: Self-pay

## 2021-02-06 DIAGNOSIS — U071 COVID-19: Secondary | ICD-10-CM | POA: Insufficient documentation

## 2021-02-06 DIAGNOSIS — F17200 Nicotine dependence, unspecified, uncomplicated: Secondary | ICD-10-CM | POA: Insufficient documentation

## 2021-02-06 LAB — COMPREHENSIVE METABOLIC PANEL
ALT: 57 U/L — ABNORMAL HIGH (ref 0–44)
AST: 208 U/L — ABNORMAL HIGH (ref 15–41)
Albumin: 4.4 g/dL (ref 3.5–5.0)
Alkaline Phosphatase: 92 U/L (ref 38–126)
Anion gap: 18 — ABNORMAL HIGH (ref 5–15)
BUN: 10 mg/dL (ref 6–20)
CO2: 19 mmol/L — ABNORMAL LOW (ref 22–32)
Calcium: 8.3 mg/dL — ABNORMAL LOW (ref 8.9–10.3)
Chloride: 104 mmol/L (ref 98–111)
Creatinine, Ser: 0.72 mg/dL (ref 0.61–1.24)
GFR, Estimated: >60 mL/min (ref >60)
Glucose, Bld: 116 mg/dL — ABNORMAL HIGH (ref 70–99)
Potassium: 3 mmol/L — ABNORMAL LOW (ref 3.5–5.1)
Sodium: 141 mmol/L (ref 135–145)
Total Bilirubin: 1.2 mg/dL (ref 0.3–1.2)
Total Protein: 7.6 g/dL (ref 6.5–8.1)

## 2021-02-06 LAB — URINALYSIS, COMPLETE (UACMP) WITH MICROSCOPIC
Bacteria, UA: NONE SEEN
Bilirubin Urine: NEGATIVE
Glucose, UA: NEGATIVE mg/dL
Hgb urine dipstick: NEGATIVE
Ketones, ur: 5 mg/dL — AB
Leukocytes,Ua: NEGATIVE
Nitrite: NEGATIVE
Protein, ur: 30 mg/dL — AB
Specific Gravity, Urine: 1.01 (ref 1.005–1.030)
pH: 7 (ref 5.0–8.0)

## 2021-02-06 LAB — CBC
HCT: 49.7 % (ref 39.0–52.0)
Hemoglobin: 17.4 g/dL — ABNORMAL HIGH (ref 13.0–17.0)
MCH: 30.4 pg (ref 26.0–34.0)
MCHC: 35 g/dL (ref 30.0–36.0)
MCV: 86.9 fL (ref 80.0–100.0)
Platelets: 265 10*3/uL (ref 150–400)
RBC: 5.72 MIL/uL (ref 4.22–5.81)
RDW: 14.7 % (ref 11.5–15.5)
WBC: 7.6 10*3/uL (ref 4.0–10.5)
nRBC: 0 % (ref 0.0–0.2)

## 2021-02-06 LAB — RESP PANEL BY RT-PCR (FLU A&B, COVID) ARPGX2
Influenza A by PCR: NEGATIVE
Influenza B by PCR: NEGATIVE
SARS Coronavirus 2 by RT PCR: POSITIVE — AB

## 2021-02-06 LAB — LIPASE, BLOOD: Lipase: 31 U/L (ref 11–51)

## 2021-02-06 MED ORDER — ONDANSETRON 8 MG PO TBDP
8.0000 mg | ORAL_TABLET | Freq: Once | ORAL | Status: AC
  Administered 2021-02-06: 8 mg via ORAL
  Filled 2021-02-06: qty 1

## 2021-02-06 MED ORDER — ONDANSETRON 4 MG PO TBDP: 4.0000 mg | ORAL_TABLET | Freq: Three times a day (TID) | ORAL | 0 refills | Status: DC | PRN

## 2021-02-06 NOTE — ED Triage Notes (Signed)
Pt c/o N/V/D for the past 2 days

## 2021-02-06 NOTE — ED Provider Notes (Signed)
[facility] Emergency Department Provider Note ____________________________________________   Event Date/Time   First MD Initiated Contact with Patient 02/06/21 1524     (approximate)  I have reviewed the triage vital signs and the nursing notes.   HISTORY  Chief Complaint Emesis and Diarrhea  HPI Jared George is a 45 y.o. male with history of nausea, vomiting, and diarrhea presents to the emergency department for treatment and evaluation.  Significant other and her daughter also have similar symptoms.  No alleviating measures attempted prior to arrival.         Past Medical History:  Diagnosis Date  . Alcohol abuse     Patient Active Problem List   Diagnosis Date Noted  . Alcohol use with uncomplicated intoxication (HCC) 01/19/2021  . Alcohol use disorder, severe, dependence (HCC) 01/19/2021  . Alcohol abuse 01/17/2021  . Alcohol withdrawal (HCC) 01/17/2021    History reviewed. No pertinent surgical history.  Prior to Admission medications   Medication Sig Start Date End Date Taking? Authorizing Provider  ondansetron (ZOFRAN-ODT) 4 MG disintegrating tablet Take 1 tablet (4 mg total) by mouth every 8 (eight) hours as needed for nausea or vomiting. 02/06/21  Yes Haelee Bolen B, FNP  gabapentin (NEURONTIN) 300 MG capsule Take 1 capsule (300 mg total) by mouth 4 (four) times daily. 01/19/21   Charm Rings, NP    Allergies Patient has no known allergies.  No family history on file.  Social History Social History   Tobacco Use  . Smoking status: Heavy Tobacco Smoker  . Smokeless tobacco: Never Used  Substance Use Topics  . Alcohol use: Yes  . Drug use: No    Review of Systems  Constitutional: Positive fever/chills Eyes: No visual changes. ENT: No sore throat. Cardiovascular: Denies chest pain. Respiratory: Denies shortness of breath. Gastrointestinal: No abdominal pain.  Positive for nausea, vomiting, and diarrhea no  constipation. Genitourinary: Negative for dysuria. Musculoskeletal: Positive for body aches  skin: Negative for rash. Neurological: Negative for headaches, focal weakness or numbness. ____________________________________________   PHYSICAL EXAM:  VITAL SIGNS: ED Triage Vitals [02/06/21 1425]  Enc Vitals Group     BP 94/80     Pulse Rate 85     Resp 18     Temp 98 F (36.7 C)     Temp src      SpO2 96 %     Weight      Height      Head Circumference      Peak Flow      Pain Score 0     Pain Loc      Pain Edu?      Excl. in GC?     Constitutional: Alert and oriented. Well appearing and in no acute distress. Eyes: Conjunctivae are normal. Head: Atraumatic. Nose: No congestion/rhinnorhea. Mouth/Throat: Mucous membranes are moist.  Oropharynx non-erythematous. Neck: No stridor.   Hematological/Lymphatic/Immunilogical: No cervical lymphadenopathy. Cardiovascular: Normal rate, regular rhythm. Grossly normal heart sounds.  Good peripheral circulation. Respiratory: Normal respiratory effort.  No retractions. Lungs CTAB. Gastrointestinal: Soft and nontender. No distention. No abdominal bruits. Genitourinary:  Musculoskeletal: No lower extremity tenderness nor edema.  No joint effusions. Neurologic:  Normal speech and language. No gross focal neurologic deficits are appreciated. No gait instability. Skin:  Skin is warm, dry and intact. No rash noted. Psychiatric: Mood and affect are normal. Speech and behavior are normal.  ____________________________________________   LABS (all labs ordered are listed, but only abnormal results  are displayed)  Labs Reviewed  RESP PANEL BY RT-PCR (FLU A&B, COVID) ARPGX2 - Abnormal; Notable for the following components:      Result Value   SARS Coronavirus 2 by RT PCR POSITIVE (*)    All other components within normal limits  COMPREHENSIVE METABOLIC PANEL - Abnormal; Notable for the following components:   Potassium 3.0 (*)    CO2 19 (*)     Glucose, Bld 116 (*)    Calcium 8.3 (*)    AST 208 (*)    ALT 57 (*)    Anion gap 18 (*)    All other components within normal limits  CBC - Abnormal; Notable for the following components:   Hemoglobin 17.4 (*)    All other components within normal limits  URINALYSIS, COMPLETE (UACMP) WITH MICROSCOPIC - Abnormal; Notable for the following components:   Color, Urine YELLOW (*)    APPearance CLEAR (*)    Ketones, ur 5 (*)    Protein, ur 30 (*)    All other components within normal limits  LIPASE, BLOOD   ____________________________________________  EKG  Not indicated ____________________________________________  RADIOLOGY  ED MD interpretation:    Not indicated I, Masson Nalepa, personally viewed and evaluated these images (plain radiographs) as part of my medical decision making, as well as reviewing the written report by the radiologist.  Official radiology report(s): No results found.  ____________________________________________   PROCEDURES  Procedure(s) performed (including Critical Care):  Procedures  ____________________________________________   INITIAL IMPRESSION / ASSESSMENT AND PLAN     45 year old male presenting to the emergency department for treatment and evaluation of symptoms as described in the HPI.  Plan will be to review protocol labs drawn while awaiting ER room assignment and get a COVID and influenza test.  We will also provide Zofran and then fluid challenge.  DIFFERENTIAL DIAGNOSIS  Viral syndrome, COVID-19, influenza, gastroenteritis  ED COURSE  COVID-19 test is positive.  After Zofran, fluid challenge did not induce nausea or vomiting.  Patient requesting to be discharged now that they know why he is having these symptoms.  He was encouraged to drink clear fluids and progress as tolerated.  He is to return to the emergency department for symptoms that are not improving, change, or worsen if unable to see primary care.     ___________________________________________   FINAL CLINICAL IMPRESSION(S) / ED DIAGNOSES  Final diagnoses:  COVID     ED Discharge Orders         Ordered    ondansetron (ZOFRAN-ODT) 4 MG disintegrating tablet  Every 8 hours PRN        02/06/21 1715           Encarnacion D Lupton was evaluated in Emergency Department on 02/06/2021 for the symptoms described in the history of present illness. He was evaluated in the context of the global COVID-19 pandemic, which necessitated consideration that the patient might be at risk for infection with the SARS-CoV-2 virus that causes COVID-19. Institutional protocols and algorithms that pertain to the evaluation of patients at risk for COVID-19 are in a state of rapid change based on information released by regulatory bodies including the CDC and federal and state organizations. These policies and algorithms were followed during the patient's care in the ED.   Note:  This document was prepared using Dragon voice recognition software and may include unintentional dictation errors.   Chinita Pester, FNP 02/06/21 1810    Concha Se, MD 02/06/21 [phone]

## 2021-02-07 ENCOUNTER — Encounter: Payer: Self-pay | Admitting: Oncology

## 2021-02-07 ENCOUNTER — Telehealth: Payer: Self-pay | Admitting: Oncology

## 2021-02-07 NOTE — Telephone Encounter (Signed)
Called to discuss with patient about COVID-19 symptoms and the use of one of the available treatments for those with mild to moderate Covid symptoms and at a high risk of hospitalization.  Pt appears to qualify for outpatient treatment due to co-morbid conditions and/or a member of an at-risk group in accordance with the FDA Emergency Use Authorization.    Symptom onset: unsure Vaccinated: no Booster? no Immunocompromised? no Qualifiers:  Past Medical History:  Diagnosis Date  . Alcohol abuse    Current smoker   NIH Criteria: Tier 2  Unable to reach pt - Unable to reach by phone- Sent [facility]   Mauro Kaufmann

## 2021-04-07 ENCOUNTER — Encounter (HOSPITAL_COMMUNITY): Payer: Self-pay | Admitting: Internal Medicine

## 2021-04-07 ENCOUNTER — Other Ambulatory Visit: Payer: Self-pay

## 2021-04-07 ENCOUNTER — Inpatient Hospital Stay (HOSPITAL_COMMUNITY)
Admission: EM | Admit: 2021-04-07 | Discharge: 2021-04-08 | DRG: 894 | Discharge status: Against Medical Advice | Payer: Self-pay | Attending: Internal Medicine | Admitting: Internal Medicine

## 2021-04-07 DIAGNOSIS — Z20822 Contact with and (suspected) exposure to covid-19: Secondary | ICD-10-CM | POA: Diagnosis present

## 2021-04-07 DIAGNOSIS — F909 Attention-deficit hyperactivity disorder, unspecified type: Secondary | ICD-10-CM | POA: Diagnosis present

## 2021-04-07 DIAGNOSIS — Y900 Blood alcohol level of less than 20 mg/100 ml: Secondary | ICD-10-CM | POA: Diagnosis present

## 2021-04-07 DIAGNOSIS — F10230 Alcohol dependence with withdrawal, uncomplicated: Secondary | ICD-10-CM

## 2021-04-07 DIAGNOSIS — Z79899 Other long term (current) drug therapy: Secondary | ICD-10-CM | POA: Diagnosis not present

## 2021-04-07 DIAGNOSIS — F10931 Alcohol use, unspecified with withdrawal delirium: Secondary | ICD-10-CM | POA: Diagnosis present

## 2021-04-07 DIAGNOSIS — F10231 Alcohol dependence with withdrawal delirium: Secondary | ICD-10-CM | POA: Diagnosis not present

## 2021-04-07 DIAGNOSIS — F10930 Alcohol use, unspecified with withdrawal, uncomplicated: Secondary | ICD-10-CM

## 2021-04-07 DIAGNOSIS — F17210 Nicotine dependence, cigarettes, uncomplicated: Secondary | ICD-10-CM | POA: Diagnosis present

## 2021-04-07 DIAGNOSIS — Z8616 Personal history of COVID-19: Secondary | ICD-10-CM | POA: Diagnosis not present

## 2021-04-07 DIAGNOSIS — I10 Essential (primary) hypertension: Secondary | ICD-10-CM | POA: Diagnosis present

## 2021-04-07 DIAGNOSIS — E876 Hypokalemia: Secondary | ICD-10-CM | POA: Insufficient documentation

## 2021-04-07 DIAGNOSIS — R739 Hyperglycemia, unspecified: Secondary | ICD-10-CM | POA: Diagnosis present

## 2021-04-07 HISTORY — DX: Essential (primary) hypertension: I10

## 2021-04-07 LAB — COMPREHENSIVE METABOLIC PANEL
ALT: 58 U/L — ABNORMAL HIGH (ref 0–44)
AST: 117 U/L — ABNORMAL HIGH (ref 15–41)
Albumin: 4.2 g/dL (ref 3.5–5.0)
Alkaline Phosphatase: 81 U/L (ref 38–126)
Anion gap: 13 (ref 5–15)
BUN: 5 mg/dL — ABNORMAL LOW (ref 6–20)
CO2: 28 mmol/L (ref 22–32)
Calcium: 9.2 mg/dL (ref 8.9–10.3)
Chloride: 96 mmol/L — ABNORMAL LOW (ref 98–111)
Creatinine, Ser: 0.79 mg/dL (ref 0.61–1.24)
GFR, Estimated: >60 mL/min (ref >60)
Glucose, Bld: 163 mg/dL — ABNORMAL HIGH (ref 70–99)
Potassium: 2.4 mmol/L — CL (ref 3.5–5.1)
Sodium: 137 mmol/L (ref 135–145)
Total Bilirubin: 1.3 mg/dL — ABNORMAL HIGH (ref 0.3–1.2)
Total Protein: 7.4 g/dL (ref 6.5–8.1)

## 2021-04-07 LAB — URINALYSIS, ROUTINE W REFLEX MICROSCOPIC
Bacteria, UA: NONE SEEN
Bilirubin Urine: NEGATIVE
Glucose, UA: 150 mg/dL — AB
Hgb urine dipstick: NEGATIVE
Ketones, ur: NEGATIVE mg/dL
Leukocytes,Ua: NEGATIVE
Nitrite: NEGATIVE
Protein, ur: 100 mg/dL — AB
Specific Gravity, Urine: 1.015 (ref 1.005–1.030)
pH: 9 — ABNORMAL HIGH (ref 5.0–8.0)

## 2021-04-07 LAB — CBC WITH DIFFERENTIAL/PLATELET
Abs Immature Granulocytes: 0.03 10*3/uL (ref 0.00–0.07)
Basophils Absolute: 0.1 10*3/uL (ref 0.0–0.1)
Basophils Relative: 1 %
Eosinophils Absolute: 0 10*3/uL (ref 0.0–0.5)
Eosinophils Relative: 0 %
HCT: 42.1 % (ref 39.0–52.0)
Hemoglobin: 14.5 g/dL (ref 13.0–17.0)
Immature Granulocytes: 1 %
Lymphocytes Relative: 6 %
Lymphs Abs: 0.4 10*3/uL — ABNORMAL LOW (ref 0.7–4.0)
MCH: 30.9 pg (ref 26.0–34.0)
MCHC: 34.4 g/dL (ref 30.0–36.0)
MCV: 89.6 fL (ref 80.0–100.0)
Monocytes Absolute: 0.3 10*3/uL (ref 0.1–1.0)
Monocytes Relative: 5 %
Neutro Abs: 5.6 10*3/uL (ref 1.7–7.7)
Neutrophils Relative %: 87 %
Platelets: 142 10*3/uL — ABNORMAL LOW (ref 150–400)
RBC: 4.7 MIL/uL (ref 4.22–5.81)
RDW: 13.4 % (ref 11.5–15.5)
WBC: 6.5 10*3/uL (ref 4.0–10.5)
nRBC: 0 % (ref 0.0–0.2)

## 2021-04-07 LAB — MAGNESIUM: Magnesium: 1.2 mg/dL — ABNORMAL LOW (ref 1.7–2.4)

## 2021-04-07 LAB — RAPID URINE DRUG SCREEN, HOSP PERFORMED
Amphetamines: NOT DETECTED
Barbiturates: NOT DETECTED
Benzodiazepines: NOT DETECTED
Cocaine: NOT DETECTED
Opiates: NOT DETECTED
Tetrahydrocannabinol: POSITIVE — AB

## 2021-04-07 LAB — RESP PANEL BY RT-PCR (FLU A&B, COVID) ARPGX2
Influenza A by PCR: NEGATIVE
Influenza B by PCR: NEGATIVE
SARS Coronavirus 2 by RT PCR: NEGATIVE

## 2021-04-07 LAB — ETHANOL: Alcohol, Ethyl (B): <10 mg/dL (ref <10)

## 2021-04-07 MED ORDER — HYDRALAZINE HCL 10 MG PO TABS: 10.0000 mg | ORAL_TABLET | Freq: Four times a day (QID) | ORAL | Status: DC | PRN

## 2021-04-07 MED ORDER — LORAZEPAM 2 MG/ML IJ SOLN
0.0000 mg | Freq: Four times a day (QID) | INTRAMUSCULAR | Status: DC
  Administered 2021-04-07: 2 mg via INTRAVENOUS
  Filled 2021-04-07: qty 1

## 2021-04-07 MED ORDER — SENNOSIDES-DOCUSATE SODIUM 8.6-50 MG PO TABS: 1.0000 | ORAL_TABLET | Freq: Every evening | ORAL | Status: DC | PRN

## 2021-04-07 MED ORDER — SODIUM CHLORIDE 0.9 % IV BOLUS
1000.0000 mL | Freq: Once | INTRAVENOUS | Status: AC
  Administered 2021-04-07: 1000 mL via INTRAVENOUS

## 2021-04-07 MED ORDER — IBUPROFEN 400 MG PO TABS
400.0000 mg | ORAL_TABLET | Freq: Four times a day (QID) | ORAL | Status: DC | PRN
Start: 2021-04-07 — End: 2021-04-08

## 2021-04-07 MED ORDER — THIAMINE HCL 100 MG/ML IJ SOLN
Freq: Once | INTRAVENOUS | Status: AC
  Filled 2021-04-07: qty 1000

## 2021-04-07 MED ORDER — ENOXAPARIN SODIUM 40 MG/0.4ML IJ SOSY
40.0000 mg | PREFILLED_SYRINGE | INTRAMUSCULAR | Status: DC
  Administered 2021-04-08: 40 mg via SUBCUTANEOUS
  Filled 2021-04-07: qty 0.4

## 2021-04-07 MED ORDER — LORAZEPAM 1 MG PO TABS: 0.0000 mg | ORAL_TABLET | Freq: Two times a day (BID) | ORAL | Status: DC

## 2021-04-07 MED ORDER — THIAMINE HCL 100 MG/ML IJ SOLN: 100.0000 mg | Freq: Every day | INTRAMUSCULAR | Status: DC

## 2021-04-07 MED ORDER — FOLIC ACID 1 MG PO TABS: 1.0000 mg | ORAL_TABLET | Freq: Every day | ORAL | Status: DC

## 2021-04-07 MED ORDER — SODIUM CHLORIDE 0.9% FLUSH: 3.0000 mL | Freq: Two times a day (BID) | INTRAVENOUS | Status: DC

## 2021-04-07 MED ORDER — ADULT MULTIVITAMIN W/MINERALS CH: 1.0000 | ORAL_TABLET | Freq: Every day | ORAL | Status: DC

## 2021-04-07 MED ORDER — ONDANSETRON HCL 4 MG PO TABS: 4.0000 mg | ORAL_TABLET | Freq: Four times a day (QID) | ORAL | Status: DC | PRN

## 2021-04-07 MED ORDER — ONDANSETRON HCL 4 MG/2ML IJ SOLN: 4.0000 mg | Freq: Four times a day (QID) | INTRAMUSCULAR | Status: DC | PRN

## 2021-04-07 MED ORDER — LORAZEPAM 2 MG/ML IJ SOLN: 0.0000 mg | Freq: Two times a day (BID) | INTRAMUSCULAR | Status: DC

## 2021-04-07 MED ORDER — HYDROCHLOROTHIAZIDE 25 MG PO TABS
25.0000 mg | ORAL_TABLET | Freq: Every day | ORAL | Status: DC
  Administered 2021-04-08: 25 mg via ORAL
  Filled 2021-04-07: qty 1

## 2021-04-07 MED ORDER — POTASSIUM CHLORIDE 10 MEQ/100ML IV SOLN
10.0000 meq | Freq: Once | INTRAVENOUS | Status: AC
  Administered 2021-04-07: 10 meq via INTRAVENOUS
  Filled 2021-04-07: qty 100

## 2021-04-07 MED ORDER — THIAMINE HCL 100 MG PO TABS: 100.0000 mg | ORAL_TABLET | Freq: Every day | ORAL | Status: DC

## 2021-04-07 MED ORDER — LORAZEPAM 2 MG/ML IJ SOLN
2.0000 mg | Freq: Once | INTRAMUSCULAR | Status: AC
  Administered 2021-04-07: 2 mg via INTRAVENOUS
  Filled 2021-04-07: qty 1

## 2021-04-07 MED ORDER — MAGNESIUM SULFATE 2 GM/50ML IV SOLN
2.0000 g | Freq: Once | INTRAVENOUS | Status: AC
  Administered 2021-04-07: 2 g via INTRAVENOUS
  Filled 2021-04-07: qty 50

## 2021-04-07 MED ORDER — THIAMINE HCL 100 MG/ML IJ SOLN
100.0000 mg | Freq: Every day | INTRAMUSCULAR | Status: DC
  Administered 2021-04-07: 100 mg via INTRAVENOUS
  Filled 2021-04-07: qty 2

## 2021-04-07 MED ORDER — LORAZEPAM 1 MG PO TABS
1.0000 mg | ORAL_TABLET | ORAL | Status: DC | PRN
  Administered 2021-04-08: 1 mg via ORAL
  Filled 2021-04-07: qty 1

## 2021-04-07 MED ORDER — POTASSIUM CHLORIDE CRYS ER 20 MEQ PO TBCR
40.0000 meq | EXTENDED_RELEASE_TABLET | Freq: Two times a day (BID) | ORAL | Status: DC
  Administered 2021-04-07: 40 meq via ORAL
  Filled 2021-04-07: qty 2

## 2021-04-07 MED ORDER — LORAZEPAM 1 MG PO TABS: 0.0000 mg | ORAL_TABLET | Freq: Four times a day (QID) | ORAL | Status: DC

## 2021-04-07 MED ORDER — LORAZEPAM 2 MG/ML IJ SOLN
1.0000 mg | INTRAMUSCULAR | Status: DC | PRN
  Administered 2021-04-08: 2 mg via INTRAVENOUS
  Administered 2021-04-08: 1 mg via INTRAVENOUS
  Administered 2021-04-08 (×2): 2 mg via INTRAVENOUS
  Administered 2021-04-08: 1 mg via INTRAVENOUS
  Filled 2021-04-07 (×6): qty 1

## 2021-04-07 MED ORDER — POTASSIUM CHLORIDE 10 MEQ/100ML IV SOLN
10.0000 meq | INTRAVENOUS | Status: DC
Start: 2021-04-07 — End: 2021-04-07
  Administered 2021-04-07 (×2): 10 meq via INTRAVENOUS
  Filled 2021-04-07 (×2): qty 100

## 2021-04-07 MED ORDER — NICOTINE 21 MG/24HR TD PT24
21.0000 mg | MEDICATED_PATCH | Freq: Every day | TRANSDERMAL | Status: DC
  Administered 2021-04-08: 21 mg via TRANSDERMAL
  Filled 2021-04-07: qty 1

## 2021-04-07 NOTE — ED Notes (Signed)
Advised by front desk pt had visitors outside. Spoke with Patent examiner and was advised that pt is not allowed to have visitors or phone calls at this time. Law enforcement remains outside of room at this time

## 2021-04-07 NOTE — ED Notes (Signed)
Admit provider at bedside at this time 

## 2021-04-07 NOTE — ED Notes (Signed)
Provider at bedside at this time

## 2021-04-07 NOTE — ED Notes (Signed)
Pt wheelchaired to restroom to have a bm at this time

## 2021-04-07 NOTE — ED Provider Notes (Signed)
[facility] EMERGENCY DEPARTMENT Provider Note   CSN: [id number] Arrival date & time: 04/07/21  1713     History No chief complaint on file.   Jared George is a 45 y.o. male brought in by EMS for alcohol withdrawal.  Patient reports that he has had tremors, abdominal pain, vomiting, headache today.  Patient reports that he has been sober and then over the last few months, he went on a relapse.  He states that his last drink was at about 9 p.m. last night.  Reports he drinks about half a liter of alcohol a day.  He states that today, he felt bad and described it as feeling "weak and tired and fatigued."  He would not tell me exactly what happened today but states that he "he woke up and his girlfriend was dead."  He tells me he did not use any drugs, denies any cocaine, marijuana use.  He does not know how many time he has been vomiting and states he has not been able to keep anything down today.  Denies any chest pain, SOB.   The history is provided by the patient.      Past Medical History:  Diagnosis Date   Alcohol abuse    Hypertension     Patient Active Problem List   Diagnosis Date Noted   Alcohol withdrawal delirium, acute, hyperactive (HCC) 04/07/2021   Hypokalemia    Hypomagnesemia    Alcohol use with uncomplicated intoxication (HCC) 01/19/2021   Alcohol use disorder, severe, dependence (HCC) 01/19/2021   Alcohol abuse 01/17/2021   Alcohol withdrawal (HCC) 01/17/2021    Past Surgical History:  Procedure Laterality Date   APPENDECTOMY N/A    HAND SURGERY Left        Family History  Family history unknown: Yes    Social History   Tobacco Use   Smoking status: Heavy Smoker   Smokeless tobacco: Never  Substance Use Topics   Alcohol use: Yes   Drug use: No    Home Medications Prior to Admission medications   Medication Sig Start Date End Date Taking? Authorizing Provider  gabapentin (NEURONTIN) 300 MG capsule Take 1 capsule (300 mg  total) by mouth 4 (four) times daily. 01/19/21   Charm Rings, NP  ondansetron (ZOFRAN-ODT) 4 MG disintegrating tablet Take 1 tablet (4 mg total) by mouth every 8 (eight) hours as needed for nausea or vomiting. 02/06/21   Chinita Pester, FNP    Allergies    Patient has no known allergies.  Review of Systems   Review of Systems  Constitutional:  Negative for fever.  Respiratory:  Negative for cough and shortness of breath.   Cardiovascular:  Negative for chest pain.  Gastrointestinal:  Positive for abdominal pain and nausea. Negative for vomiting.  Genitourinary:  Negative for dysuria and hematuria.  Neurological:  Positive for tremors and weakness (generalized). Negative for headaches.  All other systems reviewed and are negative.  Physical Exam Updated Vital Signs BP (!) 148/94   Pulse 93   Temp 98.8 F (37.1 C) (Oral)   Resp 19   Ht 5\' 9"  (1.753 m)   Wt 72.6 kg   SpO2 90%   BMI 23.63 kg/m   Physical Exam Vitals and nursing note reviewed.  Constitutional:      Appearance: Normal appearance. He is well-developed.     Comments: Appears anxious.   HENT:     Head: Normocephalic and atraumatic.  Eyes:  General: Lids are normal.     Conjunctiva/sclera: Conjunctivae normal.     Pupils: Pupils are equal, round, and reactive to light.  Cardiovascular:     Rate and Rhythm: Normal rate and regular rhythm.     Pulses: Normal pulses.     Heart sounds: Normal heart sounds. No murmur heard.   No friction rub. No gallop.  Pulmonary:     Effort: Pulmonary effort is normal.     Breath sounds: Normal breath sounds.     Comments: Lungs clear to auscultation bilaterally.  Symmetric chest rise.  No wheezing, rales, rhonchi. Abdominal:     Palpations: Abdomen is soft. Abdomen is not rigid.     Tenderness: There is abdominal tenderness. There is no guarding.     Comments: Abdomen soft, nondistended.  Diffuse tenderness with no focal point.  No rigidity, guarding.  Musculoskeletal:         General: Normal range of motion.     Cervical back: Full passive range of motion without pain.  Skin:    General: Skin is warm and dry.     Capillary Refill: Capillary refill takes less than 2 seconds.     Comments: Diaphoretic.   Neurological:     Mental Status: He is alert and oriented to person, place, and time.     Comments: Tremors noted diffusely. Cranial nerves III-XII intact Follows commands, Moves all extremities  5/5 strength to BUE and BLE  Sensation intact throughout all major nerve distributions No slurred speech. No facial droop.     Psychiatric:        Speech: Speech normal.    ED Results / Procedures / Treatments   Labs (all labs ordered are listed, but only abnormal results are displayed) Labs Reviewed  COMPREHENSIVE METABOLIC PANEL - Abnormal; Notable for the following components:      Result Value   Potassium 2.4 (*)    Chloride 96 (*)    Glucose, Bld 163 (*)    BUN 5 (*)    AST 117 (*)    ALT 58 (*)    Total Bilirubin 1.3 (*)    All other components within normal limits  CBC WITH DIFFERENTIAL/PLATELET - Abnormal; Notable for the following components:   Platelets 142 (*)    Lymphs Abs 0.4 (*)    All other components within normal limits  URINALYSIS, ROUTINE W REFLEX MICROSCOPIC - Abnormal; Notable for the following components:   pH 9.0 (*)    Glucose, UA 150 (*)    Protein, ur 100 (*)    All other components within normal limits  RAPID URINE DRUG SCREEN, HOSP PERFORMED - Abnormal; Notable for the following components:   Tetrahydrocannabinol POSITIVE (*)    All other components within normal limits  MAGNESIUM - Abnormal; Notable for the following components:   Magnesium 1.2 (*)    All other components within normal limits  RESP PANEL BY RT-PCR (FLU A&B, COVID) ARPGX2  ETHANOL  HIV ANTIBODY (ROUTINE TESTING W REFLEX)  CBC  CREATININE, SERUM  COMPREHENSIVE METABOLIC PANEL  CBC  MAGNESIUM  PHOSPHORUS    EKG EKG  Interpretation  Date/Time:  Sunday April 07 2021 18:09:08 EDT Ventricular Rate:  75 PR Interval:  147 QRS Duration: 116 QT Interval:  442 QTC Calculation: 494 R Axis:   99 Text Interpretation: Sinus rhythm Nonspecific intraventricular conduction delay ? u waves Otherwise no significant change Confirmed by Melene PlanFloyd, Dan [phone]) on 04/07/2021 6:40:47 PM  Radiology No results found.  Procedures .  Critical Care  Date/Time: 04/08/2021 12:09 AM Performed by: Maxwell Caul, PA-C Authorized by: Maxwell Caul, PA-C   Critical care provider statement:    Critical care time (minutes):  35   Critical care was necessary to treat or prevent imminent or life-threatening deterioration of the following conditions:  Metabolic crisis   Critical care was time spent personally by me on the following activities:  Discussions with consultants, evaluation of patient's response to treatment, examination of patient, ordering and performing treatments and interventions, ordering and review of laboratory studies, ordering and review of radiographic studies, pulse oximetry, re-evaluation of patient's condition, obtaining history from patient or surrogate and review of old charts   I assumed direction of critical care for this patient from another provider in my specialty: yes     Care discussed with: admitting provider     Medications Ordered in ED Medications  LORazepam (ATIVAN) tablet 1-4 mg (has no administration in time range)    Or  LORazepam (ATIVAN) injection 1-4 mg (has no administration in time range)  thiamine tablet 100 mg (has no administration in time range)    Or  thiamine (B-1) injection 100 mg (has no administration in time range)  folic acid (FOLVITE) tablet 1 mg (has no administration in time range)  multivitamin with minerals tablet 1 tablet (has no administration in time range)  hydrochlorothiazide (HYDRODIURIL) tablet 25 mg (has no administration in time range)  enoxaparin (LOVENOX)  injection 40 mg (has no administration in time range)  sodium chloride flush (NS) 0.9 % injection 3 mL (3 mLs Intravenous Not Given 04/07/21 2234)  sodium chloride 0.9 % 1,000 mL with thiamine 100 mg, folic acid 1 mg, M.V.I. Adult 10 mL infusion (has no administration in time range)  ibuprofen (ADVIL) tablet 400 mg (has no administration in time range)  senna-docusate (Senokot-S) tablet 1 tablet (has no administration in time range)  ondansetron (ZOFRAN) tablet 4 mg (has no administration in time range)    Or  ondansetron (ZOFRAN) injection 4 mg (has no administration in time range)  hydrALAZINE (APRESOLINE) tablet 10 mg (has no administration in time range)  nicotine (NICODERM CQ - dosed in mg/24 hours) patch 21 mg (has no administration in time range)  sodium chloride 0.9 % bolus 1,000 mL (0 mLs Intravenous Stopped 04/07/21 1846)  magnesium sulfate IVPB 2 g 50 mL (0 g Intravenous Stopped 04/07/21 2224)  potassium chloride 10 mEq in 100 mL IVPB (0 mEq Intravenous Stopped 04/07/21 2234)  LORazepam (ATIVAN) injection 2 mg (2 mg Intravenous Given 04/07/21 2146)    ED Course  I have reviewed the triage vital signs and the nursing notes.  Pertinent labs & imaging results that were available during my care of the patient were reviewed by me and considered in my medical decision making (see chart for details).    MDM Rules/Calculators/A&P                           45 year old male past ministry of alcohol abuse who presents for evaluation of alcohol withdrawals.  He reports that he had been sober but over the last month or 2, he started going on a binge again with his girlfriend.  He states last drink was last night at 9 PM.  He normally drinks half a liter of liquor a day.  He states waking up today, he felt tired, fatigued, felt nauseous and was having vomiting, abdominal pain.  He states he  went to take a nap when he woke up, his girlfriend was dead.  He denies any other drug use.  On initial  arrival, he is tremulous, diaphoretic, hypertensive.  Concern for alcohol withdrawal.  He does report he has had a history of seizure withdrawals.  Will place him on CIWA protocol, give fluids, thiamine.  Patient with a CIWA score of 20.  Ativan given.  We will place him on CIWA precautions.  UDS is positive for marijuana.  CMP shows potassium of 2.4.  Pinn and creatinine within normal limits.  Ethanol levels normal.  UA negative for any infectious.  Magnesium was 1.2.  CBC shows no leukocytosis.  Hemoglobin stable.   Patient does exhibit some U wave changes on V2, V3 of his EKG.  Given hypokalemia, low magneisum, plan for admission.   Discussed patient with Dr. Criselda Peaches (hospitalist) who accepts patient for admission.   Portions of this note were generated with Scientist, clinical (histocompatibility and immunogenetics). Dictation errors may occur despite best attempts at proofreading.   Final Clinical Impression(s) / ED Diagnoses Final diagnoses:  Hypokalemia  Hypomagnesemia  Alcohol withdrawal syndrome without complication [facility])    Rx / DC Orders ED Discharge Orders     None        Maxwell Caul, PA-C 04/08/21 0010    Melene Plan, DO 04/08/21 1601

## 2021-04-07 NOTE — ED Notes (Signed)
Pt is eating his dinner at this time. Intermittently talking about what occurred with his girlfriend this morning

## 2021-04-07 NOTE — ED Notes (Signed)
Law enforcement asking if pt is going home or being admitted. Spoke with provider and was advised pt would be admitted at least over night. Pt can have visitors and phone calls however they wish for a call when he gets d/c. Call [phone] prior to d/c

## 2021-04-07 NOTE — ED Notes (Signed)
Law enforcement is at bedside

## 2021-04-07 NOTE — ED Notes (Signed)
Pt ambulated back from restroom to his room at this time without assistance

## 2021-04-07 NOTE — H&P (Signed)
History and Physical    Jared George HBZ:[id number] DOB: 09-19-1975 DOA: 04/07/2021  PCP: [facility]  Patient coming from: Home   Chief Complaint: Shaking  HPI: Jared George is a 45 y.o. male with medical history significant for George, hypertension who presents in acute alcohol withdrawal.  Mr. Jared George and had been sober for about 1 year.  A few months ago Jared began drinking again and has been drinking about 1/2 liter of vodka per day.  Jared notes that his last drink was 9pm yesterday evening.  Jared awoke this morning feeling unwell, shaky, nausea and vomiting.  Jared went back to sleep.  When Jared awoke Jared George.  EMS was called and police responded as well.  Jared was brought to the ED for acute alcohol withdrawal.  Jared has a history of hallucinations and seizures with alcohol withdrawal in the past.  Jared further reports diarrhea for weeks, decreased PO intake and being hot and cold regularly, but no sweats.  Police are present outside the room.   ED Course: In the ED, Jared was found to have hypokalemia to 2.4, glucose of 163, BUN of 5, low magnesium of 1.2, AST of 117, ALT of 58, tBili of 1.3 and platelets of 142. Jared was positive for COVID in June.  UA shows protein and glucose.  Jared was + for [facility] on UDS.  EKG with possible U waves in V3.    Review of Systems: As per HPI otherwise all other systems reviewed and are negative.  Past Medical History:  Diagnosis Date   Alcohol abuse    Hypertension     Past Surgical History:  Procedure Laterality Date   APPENDECTOMY N/A    HAND SURGERY Left    Social History  reports that Jared has been smoking. Jared has never used smokeless tobacco. Jared reports current alcohol use. Jared reports that Jared does not use drugs.  No Known Allergies  Family History  Family history unknown: Yes  Jared reports that Jared does not speak to his family and does not know anything about their medical history.   Medications Reports being  on a blood pressure medication, but does not know the name.   Physical Exam: Vitals:   04/07/21 2004 04/07/21 2005 04/07/21 2134 04/07/21 2200  BP:  (!) 167/96 (!) 163/147 (!) 167/108  Pulse:  98 90 (!) 114  Resp:   15 18  Temp: 98.8 F (37.1 C)     TempSrc: Oral     SpO2:   98% 94%  Weight: 72.6 kg     Height: 5\' 9"  (1.753 m)       Constitutional: Sitting up in bed, tremulous, agitated Eyes: + conjunctival injection bilaterally, anicteric  ENMT: Mucous membranes are dry.  Neck: normal, supple Respiratory: CTAB, no wheezing or rales, no accessory muscle use.  Cardiovascular: Tachycardic, regular, no murmur noted, no pedal edema.  Abdomen: +BS, NT, ND Musculoskeletal: no clubbing / cyanosis. Normal tone and bulk Skin: reddish skin tone through, multiple tattoos over chest/arms.  Jared has no rash on exposed skin Neurologic: Tremulous, upper body shaking.  Grossly intact, possible asterixis Psychiatric: Agitated, decreased judgement and insight.     Labs on Admission: I have personally reviewed following labs and imaging studies  CBC: Recent Labs  Lab 04/07/21 1735  WBC 6.5  NEUTROABS 5.6  HGB 14.5  HCT 42.1  MCV 89.6  PLT 142*    Basic Metabolic Panel:  Recent Labs  Lab 04/07/21 1735  NA 137  K 2.4*  CL 96*  CO2 28  GLUCOSE 163*  BUN 5*  CREATININE 0.79  CALCIUM 9.2  MG 1.2*    GFR: Estimated Creatinine Clearance: 119.1 mL/min (by C-G formula based on SCr of 0.79 mg/dL).  Liver Function Tests: Recent Labs  Lab 04/07/21 1735  AST 117*  ALT 58*  ALKPHOS 81  BILITOT 1.3*  PROT 7.4  ALBUMIN 4.2    Urine analysis:    Component Value Date/Time   COLORURINE YELLOW 04/07/2021 1735   APPEARANCEUR CLEAR 04/07/2021 1735   LABSPEC 1.015 04/07/2021 1735   PHURINE 9.0 (H) 04/07/2021 1735   GLUCOSEU 150 (A) 04/07/2021 1735   HGBUR NEGATIVE 04/07/2021 1735   BILIRUBINUR NEGATIVE 04/07/2021 1735   KETONESUR NEGATIVE 04/07/2021 1735   PROTEINUR 100 (A)  04/07/2021 1735   NITRITE NEGATIVE 04/07/2021 1735   LEUKOCYTESUR NEGATIVE 04/07/2021 1735    EKG: Independently reviewed. NSR, possible U waves in V3  Assessment/Plan Alcohol withdrawal delirium, acute, hyperactive - History of seizures, admit for detoxification - CIWA with ativan - Seizure precautions - Banana bag, then daily MVI, thiamine and folate - Telemetry  Hypokalemia Hypomagnesemia  - Replace - Repeat EKG in the AM - Repeat BMET, phos, mag in the AM  Hypertension - Started on HCTZ while in house, attempt to get records of medications in the AM  Elevated random glucose Glucosuria - Check A1C with morning labs.    **From my understanding, patient is in police custody and will need to be discharged to their custody when ready.   DVT prophylaxis: Lovenox  Code Status:   Full  Family Communication:  None available  Disposition Plan:   Patient is from:  Home  Anticipated DC to:  Unknown  Anticipated DC date:  04/12/21  Anticipated DC barriers: Withdrawal complications  Consults called:  None  Admission status:  IP, telemetry   Severity of Illness: The appropriate patient status for this patient is INPATIENT. Inpatient status is judged to be reasonable and necessary in order to provide the required intensity of service to ensure the patient's safety. The patient's presenting symptoms, physical exam findings, and initial radiographic and laboratory data in the context of their chronic comorbidities is felt to place them at high risk for further clinical deterioration. Furthermore, it is not anticipated that the patient will be medically stable for discharge from the hospital within 2 midnights of admission. The following factors support the patient status of inpatient.   " The patient's presenting symptoms include alcohol withdrawal. " The worrisome physical exam findings include Tremors, agitation. " The initial radiographic and laboratory data are worrisome because of  low K and mag. " The chronic co-morbidities include George, HTN.   * I certify that at the point of admission it is my clinical judgment that the patient will require inpatient hospital care spanning beyond 2 midnights from the point of admission due to high intensity of service, high risk for further deterioration and high frequency of surveillance required.Debe Coder MD [facility]  How to contact the [facility] Attending or Consulting provider 7A - 7P or covering provider during after hours 7P -7A, for this patient?   Check the care team in [facility] and look for a) attending/consulting [facility] provider listed and b) the [facility] team listed Log into [email or web address] and use [place]'s universal password to access. If you do not have the password, please contact the hospital operator.  Locate the [facility] provider you are looking for under [facility] and page to a number that you can be directly reached. If you still have difficulty reaching the provider, please page the [facility] (Director on Call) for the Hospitalists listed on amion for assistance.  04/07/2021, 10:18 PM

## 2021-04-07 NOTE — ED Notes (Signed)
Spoke with provider reference pt tremors, diaphoresis, and becoming upset with law enforcement at bedside. Requested medication at this time

## 2021-04-07 NOTE — ED Triage Notes (Signed)
Pt comes from home via EMS. EMS was called out for pt's girlfriend. EMS provided CPR but called it on scene. Pt admits alcohol binge x3 months. Last known intake was 1/2 gallon of vodka completed at 2000 yesterday. Pt reports severe shakes at 0600 this morning. Pt also reports dizziness and lack of balance but pt ambulatory to truck. Pt a/o x4. BP 156/98 HR 86 RR 18 O2 98% RA CBG 273

## 2021-04-07 NOTE — ED Notes (Signed)
Advised pt will be placed into law enforcement custody at this time and will need to be placed in at least one handcuff to the bed

## 2021-04-07 NOTE — ED Notes (Signed)
Received verbal report from Zoe H RN at this time 

## 2021-04-08 LAB — MAGNESIUM: Magnesium: 1.5 mg/dL — ABNORMAL LOW (ref 1.7–2.4)

## 2021-04-08 LAB — COMPREHENSIVE METABOLIC PANEL
ALT: 44 U/L (ref 0–44)
AST: 78 U/L — ABNORMAL HIGH (ref 15–41)
Albumin: 3.5 g/dL (ref 3.5–5.0)
Alkaline Phosphatase: 71 U/L (ref 38–126)
Anion gap: 10 (ref 5–15)
BUN: <5 mg/dL — ABNORMAL LOW (ref 6–20)
CO2: 25 mmol/L (ref 22–32)
Calcium: 8.3 mg/dL — ABNORMAL LOW (ref 8.9–10.3)
Chloride: 103 mmol/L (ref 98–111)
Creatinine, Ser: 0.61 mg/dL (ref 0.61–1.24)
GFR, Estimated: >60 mL/min (ref >60)
Glucose, Bld: 81 mg/dL (ref 70–99)
Potassium: 2.7 mmol/L — CL (ref 3.5–5.1)
Sodium: 138 mmol/L (ref 135–145)
Total Bilirubin: 1 mg/dL (ref 0.3–1.2)
Total Protein: 6.5 g/dL (ref 6.5–8.1)

## 2021-04-08 LAB — CBC
HCT: 40.5 % (ref 39.0–52.0)
Hemoglobin: 14.2 g/dL (ref 13.0–17.0)
MCH: 31.3 pg (ref 26.0–34.0)
MCHC: 35.1 g/dL (ref 30.0–36.0)
MCV: 89.4 fL (ref 80.0–100.0)
Platelets: UNDETERMINED 10*3/uL (ref 150–400)
RBC: 4.53 MIL/uL (ref 4.22–5.81)
RDW: 13.3 % (ref 11.5–15.5)
WBC: 3.4 10*3/uL — ABNORMAL LOW (ref 4.0–10.5)
nRBC: 0 % (ref 0.0–0.2)

## 2021-04-08 LAB — HEMOGLOBIN A1C
Hgb A1c MFr Bld: 5.3 % (ref 4.8–5.6)
Mean Plasma Glucose: 105.41 mg/dL

## 2021-04-08 LAB — PHOSPHORUS: Phosphorus: 2.8 mg/dL (ref 2.5–4.6)

## 2021-04-08 LAB — HIV ANTIBODY (ROUTINE TESTING W REFLEX): HIV Screen 4th Generation wRfx: NONREACTIVE

## 2021-04-08 MED ORDER — CLONIDINE HCL 0.1 MG PO TABS
0.1000 mg | ORAL_TABLET | Freq: Two times a day (BID) | ORAL | Status: DC
  Administered 2021-04-08: 0.1 mg via ORAL
  Filled 2021-04-08 (×2): qty 1

## 2021-04-08 MED ORDER — DEXTROSE 5 % IV SOLN: INTRAVENOUS | Status: DC

## 2021-04-08 MED ORDER — MAGNESIUM OXIDE -MG SUPPLEMENT 400 (240 MG) MG PO TABS
400.0000 mg | ORAL_TABLET | Freq: Two times a day (BID) | ORAL | Status: DC
  Administered 2021-04-08: 400 mg via ORAL
  Filled 2021-04-08 (×2): qty 1

## 2021-04-08 MED ORDER — POTASSIUM CHLORIDE 10 MEQ/100ML IV SOLN
10.0000 meq | INTRAVENOUS | Status: DC
Start: 2021-04-08 — End: 2021-04-08

## 2021-04-08 MED ORDER — POTASSIUM CHLORIDE CRYS ER 20 MEQ PO TBCR
40.0000 meq | EXTENDED_RELEASE_TABLET | Freq: Two times a day (BID) | ORAL | Status: DC
  Administered 2021-04-08: 40 meq via ORAL
  Filled 2021-04-08 (×2): qty 2

## 2021-04-08 MED ORDER — MAGNESIUM SULFATE 2 GM/50ML IV SOLN
2.0000 g | Freq: Once | INTRAVENOUS | Status: AC
  Administered 2021-04-08: 2 g via INTRAVENOUS
  Filled 2021-04-08: qty 50

## 2021-04-08 MED ORDER — LORAZEPAM 2 MG/ML IJ SOLN
2.0000 mg | Freq: Once | INTRAMUSCULAR | Status: AC
  Administered 2021-04-08: 2 mg via INTRAVENOUS
  Filled 2021-04-08: qty 1

## 2021-04-08 MED ORDER — POTASSIUM CHLORIDE 10 MEQ/100ML IV SOLN
10.0000 meq | INTRAVENOUS | Status: DC
Start: 2021-04-08 — End: 2021-04-08
  Administered 2021-04-08 (×2): 10 meq via INTRAVENOUS
  Filled 2021-04-08 (×2): qty 100

## 2021-04-08 MED ORDER — SODIUM CHLORIDE 0.9 % IV BOLUS
1000.0000 mL | Freq: Once | INTRAVENOUS | Status: AC
  Administered 2021-04-08: 1000 mL via INTRAVENOUS

## 2021-04-08 NOTE — ED Notes (Signed)
Law enforcement remains outside of room

## 2021-04-08 NOTE — ED Notes (Signed)
Patient is resting quietly with eyes closed.

## 2021-04-08 NOTE — Progress Notes (Signed)
Pt requested to leave AMA. [facility] paged. MD called RN, stating the pt couldn't leave due to being admitted for alcohol withdrawal and having warrants for arrest. MD requested to talk to pt via phone. RN gave phone to pt. At the end of the conversation, pt stated that he understood the risk and chose to be with his family. Pt hung the phone up on MD, grabbed his belongings and proceeded to leave floor with daughter without signing AMA paperwork. Security called, no answer. MD updated. Sheriffs office notified that pt left premises. Stated they were en route.

## 2021-04-08 NOTE — Progress Notes (Addendum)
Pt wanted to leave AMA. Nightshift RN [facility] will reach out to attending and attempt to have pt sign form.   This nurse spoke with sheriff's attorney J Secor and updated about pt leaving AMA. They stated that they will handle it.   Received a call from an officer that is en route.   Called officer to update that pt left AMA. Security called x2 w/o answer.   AC is aware of situation.   Officer called asking for d/c paperwork to be able to take pt to jail. On call MD is paged.   2240 spoke with Officer [place] concerning D/C status. Updated them that pt left AMA so he wasn't dced and can't be given d/c paperwork. Officer stated that they understand.   AC is updated of current situation.

## 2021-04-08 NOTE — ED Notes (Signed)
Placed Breakfast Order 

## 2021-04-08 NOTE — Progress Notes (Signed)
Overnight event  Notified by RN that patient is currently AAO x4 and wants to leave AGAINST MEDICAL ADVICE.  I immediately called back RN and she gave the phone to the patient so I could speak to him.  Patient told me that his family is here and he wants to leave right now.  I explained to him that he needs to stay in the hospital as he is undergoing alcohol withdrawal and is very high risk for severe withdrawal/seizures/delirium given last drink was 48 hours ago.  Explained to him that leaving the hospital is AGAINST MEDICAL ADVICE.  Patient hung up the phone.  I immediately called back and RN informed me that the patient had already left the unit.  Unfortunately, I did not have the opportunity to even see him as he already left.

## 2021-04-08 NOTE — ED Notes (Signed)
Upon returning to patient's room, he is asleep and snoring. Will re-evaluate CIWA within the hour.

## 2021-04-08 NOTE — ED Notes (Signed)
Patient was assisted with sitting up on stretcher. Lunch tray within reach and patient is eating. Tremors noted. Patient is A/O.

## 2021-04-08 NOTE — Plan of Care (Signed)
  Problem: Education: Goal: Knowledge of General Education information will improve Description Including pain rating scale, medication(s)/side effects and non-pharmacologic comfort measures Outcome: Progressing   

## 2021-04-08 NOTE — Progress Notes (Signed)
[facility] PROGRESS NOTE    Progress Note  Jared George  EZM:[id number] DOB: [birth year] DOA: 04/07/2021 [facility]: [facility]     Brief Narrative:   Jared George is an 45 y.o. male past medical history of alcohol abuse, essential hypertension was been drinking about half a liter of vodka per day last drink was yesterday at 9 PM woke up feeling unwell and nauseated vomiting, and when he woke up he found his girlfriend did called EMS found him in alcohol withdrawal and hallucinating the ED potassium was 2.4 magnesium 1.2 COVID-positive in June UDS was positive for cannabis    Assessment/Plan:   Alcohol withdrawal delirium, acute, hyperactive: With a history of seizures admitted for detox. Monitor with CIWA protocol, also seizure precautions. He was given a banana bag continue thiamine and folate. CIWA score 7 the last 1 after several doses of Ativan. Tachycardia has resolved blood pressure still elevated.  Electrolyte imbalance hypokalemia/hypomagnesemia: Repeating IV repeated basic metabolic panels pending.  Essential hypertension: Continue on his home dose of hydrochlorothiazide.  Elevated blood glucose: A1c pending blood glucose improving.    DVT prophylaxis: lovenox Family Communication:none Status is: Inpatient  Remains inpatient appropriate because:Hemodynamically unstable  Dispo: The patient is from: Home              Anticipated d/c is to: Home              Patient currently is medically stable to d/c.   Difficult to place patient Yes   Code Status:     Code Status Orders  (From admission, onward)           Start     Ordered   04/07/21 2229  Full code  Continuous        04/07/21 2228           Code Status History     Date Active Date Inactive Code Status Order ID Comments User Context   01/18/2021 2357 01/19/2021 2302 Full Code [id number]  Ward, Layla Maw, DO ED         IV Access:   Peripheral  IV   Procedures and diagnostic studies:   No results found.   Medical Consultants:   None.   Subjective:    Jared George relates still shaky nervous.  Objective:    Vitals:   04/08/21 0515 04/08/21 0530 04/08/21 0615 04/08/21 0717  BP: (!) 149/100 (!) 156/102 (!) 150/108 (!) 153/102  Pulse: 67 66 63 90  Resp: 14 (!) 21 (!) 33   Temp:      TempSrc:      SpO2: 94% 94% 96%   Weight:      Height:       SpO2: 96 %   Intake/Output Summary (Last 24 hours) at 04/08/2021 0730 Last data filed at 04/08/2021 0728 Gross per 24 hour  Intake 380.93 ml  Output 800 ml  Net -419.07 ml   Filed Weights   04/07/21 2004  Weight: 72.6 kg    Exam: General exam: In no acute distress. Respiratory system: Good air movement and clear to auscultation. Cardiovascular system: S1 & S2 heard, RRR. No JVD. Gastrointestinal system: Abdomen is nondistended, soft and nontender.  Extremities: No pedal edema. Skin: No rashes, lesions or ulcers Data Reviewed:    Labs: Basic Metabolic Panel: Recent Labs  Lab 04/07/21 1735 04/08/21 0418  NA 137 138  K 2.4* 2.7*  CL 96* 103  CO2 28 25  GLUCOSE  163* 81  BUN 5* <5*  CREATININE 0.79 0.61  CALCIUM 9.2 8.3*  MG 1.2* 1.5*  PHOS  --  2.8   GFR Estimated Creatinine Clearance: 119.1 mL/min (by C-G formula based on SCr of 0.61 mg/dL). Liver Function Tests: Recent Labs  Lab 04/07/21 1735 04/08/21 0418  AST 117* 78*  ALT 58* 44  ALKPHOS 81 71  BILITOT 1.3* 1.0  PROT 7.4 6.5  ALBUMIN 4.2 3.5   No results for input(s): LIPASE, AMYLASE in the last 168 hours. No results for input(s): AMMONIA in the last 168 hours. Coagulation profile No results for input(s): INR, PROTIME in the last 168 hours. COVID-19 Labs  No results for input(s): DDIMER, FERRITIN, LDH, CRP in the last 72 hours.  Lab Results  Component Value Date   SARSCOV2NAA NEGATIVE 04/07/2021   SARSCOV2NAA POSITIVE (A) 02/06/2021   SARSCOV2NAA NEGATIVE 01/19/2021    SARSCOV2NAA NEGATIVE 07/28/2019    CBC: Recent Labs  Lab 04/07/21 1735 04/08/21 0418  WBC 6.5 3.4*  NEUTROABS 5.6  --   HGB 14.5 14.2  HCT 42.1 40.5  MCV 89.6 89.4  PLT 142* PLATELET CLUMPS NOTED ON SMEAR, UNABLE TO ESTIMATE   Cardiac Enzymes: No results for input(s): CKTOTAL, CKMB, CKMBINDEX, TROPONINI in the last 168 hours. BNP (last 3 results) No results for input(s): PROBNP in the last 8760 hours. CBG: No results for input(s): GLUCAP in the last 168 hours. D-Dimer: No results for input(s): DDIMER in the last 72 hours. Hgb A1c: No results for input(s): HGBA1C in the last 72 hours. Lipid Profile: No results for input(s): CHOL, HDL, LDLCALC, TRIG, CHOLHDL, LDLDIRECT in the last 72 hours. Thyroid function studies: No results for input(s): TSH, T4TOTAL, T3FREE, THYROIDAB in the last 72 hours.  Invalid input(s): FREET3 Anemia work up: No results for input(s): VITAMINB12, FOLATE, FERRITIN, TIBC, IRON, RETICCTPCT in the last 72 hours. Sepsis Labs: Recent Labs  Lab 04/07/21 1735 04/08/21 0418  WBC 6.5 3.4*   Microbiology Recent Results (from the past 240 hour(s))  Resp Panel by RT-PCR (Flu A&B, Covid) Nasopharyngeal Swab     Status: None   Collection Time: 04/07/21  9:17 PM   Specimen: Nasopharyngeal Swab; Nasopharyngeal(NP) swabs in vial transport medium  Result Value Ref Range Status   SARS Coronavirus 2 by RT PCR NEGATIVE NEGATIVE Final    Comment: (NOTE) SARS-CoV-2 target nucleic acids are NOT DETECTED.  The SARS-CoV-2 RNA is generally detectable in upper respiratory specimens during the acute phase of infection. The lowest concentration of SARS-CoV-2 viral copies this assay can detect is 138 copies/mL. A negative result does not preclude SARS-Cov-2 infection and should not be used as the sole basis for treatment or other patient management decisions. A negative result may occur with  improper specimen collection/handling, submission of specimen other than  nasopharyngeal swab, presence of viral mutation(s) within the areas targeted by this assay, and inadequate number of viral copies(<138 copies/mL). A negative result must be combined with clinical observations, patient history, and epidemiological information. The expected result is Negative.  Fact Sheet for Patients:  BloggerCourse.com  Fact Sheet for Healthcare Providers:  SeriousBroker.it  This test is no t yet approved or cleared by the [place] FDA and  has been authorized for detection and/or diagnosis of SARS-CoV-2 by FDA under an Emergency Use Authorization (EUA). This EUA will remain  in effect (meaning this test can be used) for the duration of the COVID-19 declaration under Section 564(b)(1) of the Act, 21 U.S.C.section 360bbb-3(b)(1), unless the  authorization is terminated  or revoked sooner.       Influenza A by PCR NEGATIVE NEGATIVE Final   Influenza B by PCR NEGATIVE NEGATIVE Final    Comment: (NOTE) The Xpert Xpress SARS-CoV-2/FLU/RSV plus assay is intended as an aid in the diagnosis of influenza from Nasopharyngeal swab specimens and should not be used as a sole basis for treatment. Nasal washings and aspirates are unacceptable for Xpert Xpress SARS-CoV-2/FLU/RSV testing.  Fact Sheet for Patients: BloggerCourse.com  Fact Sheet for Healthcare Providers: SeriousBroker.it  This test is not yet approved or cleared by the [place] FDA and has been authorized for detection and/or diagnosis of SARS-CoV-2 by FDA under an Emergency Use Authorization (EUA). This EUA will remain in effect (meaning this test can be used) for the duration of the COVID-19 declaration under Section 564(b)(1) of the Act, 21 U.S.C. section 360bbb-3(b)(1), unless the authorization is terminated or revoked.  Performed at [facility], [address], [place], [place] [address]       Medications:    enoxaparin (LOVENOX) injection  40 mg Subcutaneous Q24H   folic acid  1 mg Oral Daily   hydrochlorothiazide  25 mg Oral Daily   multivitamin with minerals  1 tablet Oral Daily   nicotine  21 mg Transdermal Daily   sodium chloride flush  3 mL Intravenous Q12H   thiamine  100 mg Oral Daily   Or   thiamine  100 mg Intravenous Daily   Continuous Infusions:  potassium chloride 10 mEq (04/08/21 0727)      LOS: 1 day   Marinda Elk  [facility]  04/08/2021, 7:30 AM      '

## 2021-04-09 ENCOUNTER — Inpatient Hospital Stay (HOSPITAL_COMMUNITY)
Admission: EM | Admit: 2021-04-09 | Discharge: 2021-04-14 | DRG: 897 | Attending: Internal Medicine | Admitting: Internal Medicine

## 2021-04-09 ENCOUNTER — Encounter (HOSPITAL_COMMUNITY): Payer: Self-pay | Admitting: Family Medicine

## 2021-04-09 DIAGNOSIS — Z720 Tobacco use: Secondary | ICD-10-CM

## 2021-04-09 DIAGNOSIS — R197 Diarrhea, unspecified: Secondary | ICD-10-CM | POA: Diagnosis present

## 2021-04-09 DIAGNOSIS — E876 Hypokalemia: Secondary | ICD-10-CM | POA: Diagnosis present

## 2021-04-09 DIAGNOSIS — F10239 Alcohol dependence with withdrawal, unspecified: Principal | ICD-10-CM

## 2021-04-09 DIAGNOSIS — F10230 Alcohol dependence with withdrawal, uncomplicated: Secondary | ICD-10-CM | POA: Diagnosis not present

## 2021-04-09 DIAGNOSIS — F10130 Alcohol abuse with withdrawal, uncomplicated: Principal | ICD-10-CM | POA: Diagnosis present

## 2021-04-09 DIAGNOSIS — I10 Essential (primary) hypertension: Secondary | ICD-10-CM | POA: Diagnosis present

## 2021-04-09 DIAGNOSIS — F17210 Nicotine dependence, cigarettes, uncomplicated: Secondary | ICD-10-CM | POA: Diagnosis present

## 2021-04-09 DIAGNOSIS — F10939 Alcohol use, unspecified with withdrawal, unspecified: Secondary | ICD-10-CM | POA: Diagnosis present

## 2021-04-09 DIAGNOSIS — Z20822 Contact with and (suspected) exposure to covid-19: Secondary | ICD-10-CM | POA: Diagnosis present

## 2021-04-09 DIAGNOSIS — F419 Anxiety disorder, unspecified: Secondary | ICD-10-CM | POA: Diagnosis present

## 2021-04-09 DIAGNOSIS — G4700 Insomnia, unspecified: Secondary | ICD-10-CM | POA: Diagnosis present

## 2021-04-09 DIAGNOSIS — R739 Hyperglycemia, unspecified: Secondary | ICD-10-CM | POA: Diagnosis present

## 2021-04-09 DIAGNOSIS — F1010 Alcohol abuse, uncomplicated: Secondary | ICD-10-CM | POA: Diagnosis present

## 2021-04-09 LAB — BASIC METABOLIC PANEL
Anion gap: 10 (ref 5–15)
BUN: <5 mg/dL — ABNORMAL LOW (ref 6–20)
CO2: 25 mmol/L (ref 22–32)
Calcium: 8.5 mg/dL — ABNORMAL LOW (ref 8.9–10.3)
Chloride: 103 mmol/L (ref 98–111)
Creatinine, Ser: 0.74 mg/dL (ref 0.61–1.24)
GFR, Estimated: >60 mL/min (ref >60)
Glucose, Bld: 59 mg/dL — ABNORMAL LOW (ref 70–99)
Potassium: 3 mmol/L — ABNORMAL LOW (ref 3.5–5.1)
Sodium: 138 mmol/L (ref 135–145)

## 2021-04-09 LAB — CBC WITH DIFFERENTIAL/PLATELET
Abs Immature Granulocytes: 0.03 10*3/uL (ref 0.00–0.07)
Basophils Absolute: 0 10*3/uL (ref 0.0–0.1)
Basophils Relative: 1 %
Eosinophils Absolute: 0 10*3/uL (ref 0.0–0.5)
Eosinophils Relative: 1 %
HCT: 41.5 % (ref 39.0–52.0)
Hemoglobin: 14.2 g/dL (ref 13.0–17.0)
Immature Granulocytes: 1 %
Lymphocytes Relative: 14 %
Lymphs Abs: 0.9 10*3/uL (ref 0.7–4.0)
MCH: 31.2 pg (ref 26.0–34.0)
MCHC: 34.2 g/dL (ref 30.0–36.0)
MCV: 91.2 fL (ref 80.0–100.0)
Monocytes Absolute: 0.4 10*3/uL (ref 0.1–1.0)
Monocytes Relative: 7 %
Neutro Abs: 5 10*3/uL (ref 1.7–7.7)
Neutrophils Relative %: 76 %
Platelets: 96 10*3/uL — ABNORMAL LOW (ref 150–400)
RBC: 4.55 MIL/uL (ref 4.22–5.81)
RDW: 13.3 % (ref 11.5–15.5)
Smear Review: DECREASED
WBC: 6.4 10*3/uL (ref 4.0–10.5)
nRBC: 0 % (ref 0.0–0.2)

## 2021-04-09 LAB — COMPREHENSIVE METABOLIC PANEL
ALT: 55 U/L — ABNORMAL HIGH (ref 0–44)
AST: 97 U/L — ABNORMAL HIGH (ref 15–41)
Albumin: 4.1 g/dL (ref 3.5–5.0)
Alkaline Phosphatase: 70 U/L (ref 38–126)
Anion gap: 13 (ref 5–15)
BUN: 5 mg/dL — ABNORMAL LOW (ref 6–20)
CO2: 23 mmol/L (ref 22–32)
Calcium: 8.6 mg/dL — ABNORMAL LOW (ref 8.9–10.3)
Chloride: 102 mmol/L (ref 98–111)
Creatinine, Ser: 0.83 mg/dL (ref 0.61–1.24)
GFR, Estimated: >60 mL/min (ref >60)
Glucose, Bld: 138 mg/dL — ABNORMAL HIGH (ref 70–99)
Potassium: 2.9 mmol/L — ABNORMAL LOW (ref 3.5–5.1)
Sodium: 138 mmol/L (ref 135–145)
Total Bilirubin: 1.2 mg/dL (ref 0.3–1.2)
Total Protein: 7 g/dL (ref 6.5–8.1)

## 2021-04-09 LAB — CBC
HCT: 39.6 % (ref 39.0–52.0)
Hemoglobin: 13.7 g/dL (ref 13.0–17.0)
MCH: 31.5 pg (ref 26.0–34.0)
MCHC: 34.6 g/dL (ref 30.0–36.0)
MCV: 91 fL (ref 80.0–100.0)
Platelets: 87 10*3/uL — ABNORMAL LOW (ref 150–400)
RBC: 4.35 MIL/uL (ref 4.22–5.81)
RDW: 13.2 % (ref 11.5–15.5)
WBC: 5.4 10*3/uL (ref 4.0–10.5)
nRBC: 0 % (ref 0.0–0.2)

## 2021-04-09 LAB — MAGNESIUM: Magnesium: 1.6 mg/dL — ABNORMAL LOW (ref 1.7–2.4)

## 2021-04-09 LAB — CBG MONITORING, ED: Glucose-Capillary: 103 mg/dL — ABNORMAL HIGH (ref 70–99)

## 2021-04-09 MED ORDER — LOSARTAN POTASSIUM 50 MG PO TABS
50.0000 mg | ORAL_TABLET | Freq: Every day | ORAL | Status: DC
  Administered 2021-04-09 – 2021-04-14 (×6): 50 mg via ORAL
  Filled 2021-04-09 (×7): qty 1

## 2021-04-09 MED ORDER — ACETAMINOPHEN 325 MG PO TABS
650.0000 mg | ORAL_TABLET | Freq: Once | ORAL | Status: AC
  Administered 2021-04-09: 650 mg via ORAL
  Filled 2021-04-09: qty 2

## 2021-04-09 MED ORDER — THIAMINE HCL 100 MG/ML IJ SOLN
100.0000 mg | Freq: Every day | INTRAMUSCULAR | Status: DC
  Administered 2021-04-13: 100 mg via INTRAVENOUS
  Filled 2021-04-09: qty 2

## 2021-04-09 MED ORDER — POTASSIUM CHLORIDE CRYS ER 20 MEQ PO TBCR
40.0000 meq | EXTENDED_RELEASE_TABLET | Freq: Three times a day (TID) | ORAL | Status: AC
  Administered 2021-04-09 (×3): 40 meq via ORAL
  Filled 2021-04-09 (×3): qty 2

## 2021-04-09 MED ORDER — THIAMINE HCL 100 MG PO TABS
100.0000 mg | ORAL_TABLET | Freq: Every day | ORAL | Status: DC
  Filled 2021-04-09: qty 1

## 2021-04-09 MED ORDER — NICOTINE 21 MG/24HR TD PT24
21.0000 mg | MEDICATED_PATCH | Freq: Once | TRANSDERMAL | Status: DC
  Administered 2021-04-09: 21 mg via TRANSDERMAL
  Filled 2021-04-09: qty 1

## 2021-04-09 MED ORDER — LACTATED RINGERS IV SOLN: INTRAVENOUS | Status: DC

## 2021-04-09 MED ORDER — ACETAMINOPHEN 325 MG PO TABS
650.0000 mg | ORAL_TABLET | Freq: Four times a day (QID) | ORAL | Status: DC | PRN
  Administered 2021-04-12: 650 mg via ORAL
  Filled 2021-04-09: qty 2

## 2021-04-09 MED ORDER — LORAZEPAM 2 MG/ML IJ SOLN
0.0000 mg | Freq: Four times a day (QID) | INTRAMUSCULAR | Status: AC
Start: 2021-04-09 — End: 2021-04-11
  Administered 2021-04-09: 1 mg via INTRAVENOUS
  Administered 2021-04-09: 2 mg via INTRAVENOUS
  Administered 2021-04-09: 1 mg via INTRAVENOUS
  Administered 2021-04-09 – 2021-04-11 (×5): 2 mg via INTRAVENOUS
  Filled 2021-04-09 (×6): qty 1

## 2021-04-09 MED ORDER — THIAMINE HCL 100 MG PO TABS
100.0000 mg | ORAL_TABLET | Freq: Every day | ORAL | Status: DC
  Administered 2021-04-09 – 2021-04-14 (×5): 100 mg via ORAL
  Filled 2021-04-09 (×6): qty 1

## 2021-04-09 MED ORDER — FOLIC ACID 1 MG PO TABS
1.0000 mg | ORAL_TABLET | Freq: Every day | ORAL | Status: DC
  Filled 2021-04-09: qty 1

## 2021-04-09 MED ORDER — POTASSIUM CHLORIDE 10 MEQ/100ML IV SOLN
10.0000 meq | INTRAVENOUS | Status: AC
  Administered 2021-04-09 (×6): 10 meq via INTRAVENOUS
  Filled 2021-04-09 (×6): qty 100

## 2021-04-09 MED ORDER — LORAZEPAM 0.5 MG PO TABS
0.0000 mg | ORAL_TABLET | Freq: Two times a day (BID) | ORAL | Status: AC
Start: 2021-04-11 — End: 2021-04-12
  Administered 2021-04-11: 3 mg via ORAL
  Administered 2021-04-11: 2 mg via ORAL
  Administered 2021-04-12: 4 mg via ORAL
  Filled 2021-04-09: qty 8
  Filled 2021-04-09: qty 4

## 2021-04-09 MED ORDER — SENNOSIDES-DOCUSATE SODIUM 8.6-50 MG PO TABS: 1.0000 | ORAL_TABLET | Freq: Every evening | ORAL | Status: DC | PRN

## 2021-04-09 MED ORDER — ACETAMINOPHEN 650 MG RE SUPP: 650.0000 mg | Freq: Four times a day (QID) | RECTAL | Status: DC | PRN

## 2021-04-09 MED ORDER — CHLORDIAZEPOXIDE HCL 5 MG PO CAPS
10.0000 mg | ORAL_CAPSULE | Freq: Three times a day (TID) | ORAL | Status: DC
  Administered 2021-04-09: 10 mg via ORAL
  Filled 2021-04-09: qty 1
  Filled 2021-04-09: qty 2

## 2021-04-09 MED ORDER — CHLORDIAZEPOXIDE HCL 10 MG PO CAPS
10.0000 mg | ORAL_CAPSULE | Freq: Three times a day (TID) | ORAL | Status: AC
  Administered 2021-04-09 (×2): 10 mg via ORAL
  Filled 2021-04-09 (×2): qty 1
  Filled 2021-04-09: qty 2

## 2021-04-09 MED ORDER — ADULT MULTIVITAMIN W/MINERALS CH
1.0000 | ORAL_TABLET | Freq: Every day | ORAL | Status: DC
  Administered 2021-04-09 – 2021-04-14 (×6): 1 via ORAL
  Filled 2021-04-09 (×6): qty 1

## 2021-04-09 MED ORDER — LORAZEPAM 0.5 MG PO TABS: 0.0000 mg | ORAL_TABLET | Freq: Four times a day (QID) | ORAL | Status: AC

## 2021-04-09 MED ORDER — FOLIC ACID 1 MG PO TABS
1.0000 mg | ORAL_TABLET | Freq: Every day | ORAL | Status: DC
  Administered 2021-04-09 – 2021-04-14 (×6): 1 mg via ORAL
  Filled 2021-04-09 (×6): qty 1

## 2021-04-09 MED ORDER — LORAZEPAM 2 MG/ML IJ SOLN
1.0000 mg | INTRAMUSCULAR | Status: AC | PRN
  Administered 2021-04-09 – 2021-04-10 (×4): 2 mg via INTRAVENOUS
  Filled 2021-04-09 (×5): qty 1

## 2021-04-09 MED ORDER — MAGNESIUM OXIDE -MG SUPPLEMENT 400 (240 MG) MG PO TABS
400.0000 mg | ORAL_TABLET | Freq: Two times a day (BID) | ORAL | Status: AC
  Administered 2021-04-09 (×2): 400 mg via ORAL
  Filled 2021-04-09 (×2): qty 1

## 2021-04-09 MED ORDER — NICOTINE 21 MG/24HR TD PT24
21.0000 mg | MEDICATED_PATCH | Freq: Every day | TRANSDERMAL | Status: DC
  Administered 2021-04-09 – 2021-04-14 (×5): 21 mg via TRANSDERMAL
  Filled 2021-04-09 (×7): qty 1

## 2021-04-09 MED ORDER — LORAZEPAM 2 MG/ML IJ SOLN
2.0000 mg | Freq: Once | INTRAMUSCULAR | Status: AC
  Administered 2021-04-09: 2 mg via INTRAVENOUS
  Filled 2021-04-09: qty 1

## 2021-04-09 MED ORDER — LORAZEPAM 2 MG/ML IJ SOLN
0.0000 mg | Freq: Two times a day (BID) | INTRAMUSCULAR | Status: AC
  Administered 2021-04-12: 2 mg via INTRAVENOUS
  Filled 2021-04-09: qty 1

## 2021-04-09 MED ORDER — MAGNESIUM SULFATE 2 GM/50ML IV SOLN
2.0000 g | Freq: Once | INTRAVENOUS | Status: AC
  Administered 2021-04-09: 2 g via INTRAVENOUS
  Filled 2021-04-09: qty 50

## 2021-04-09 MED ORDER — SODIUM CHLORIDE 0.9 % IV SOLN: INTRAVENOUS | Status: AC

## 2021-04-09 MED ORDER — THIAMINE HCL 100 MG/ML IJ SOLN: 100.0000 mg | Freq: Every day | INTRAMUSCULAR | Status: DC

## 2021-04-09 MED ORDER — LORAZEPAM 1 MG PO TABS
1.0000 mg | ORAL_TABLET | ORAL | Status: AC | PRN
  Administered 2021-04-09 – 2021-04-10 (×2): 2 mg via ORAL
  Administered 2021-04-12: 3 mg via ORAL
  Filled 2021-04-09: qty 2
  Filled 2021-04-09 (×2): qty 3
  Filled 2021-04-09: qty 2

## 2021-04-09 MED ORDER — POTASSIUM CHLORIDE CRYS ER 20 MEQ PO TBCR
40.0000 meq | EXTENDED_RELEASE_TABLET | Freq: Once | ORAL | Status: AC
  Administered 2021-04-09: 40 meq via ORAL
  Filled 2021-04-09: qty 2

## 2021-04-09 MED ORDER — ENOXAPARIN SODIUM 40 MG/0.4ML IJ SOSY
40.0000 mg | PREFILLED_SYRINGE | Freq: Every day | INTRAMUSCULAR | Status: DC
  Administered 2021-04-09 – 2021-04-13 (×5): 40 mg via SUBCUTANEOUS
  Filled 2021-04-09 (×5): qty 0.4

## 2021-04-09 MED ORDER — ADULT MULTIVITAMIN W/MINERALS CH
1.0000 | ORAL_TABLET | Freq: Every day | ORAL | Status: DC
  Filled 2021-04-09: qty 1

## 2021-04-09 NOTE — ED Notes (Signed)
Pt ambulated to the bathroom on his own power,gait even and steady. LEO accompanied pt to bathroom and remains outside bathroom while pt is in bathroom

## 2021-04-09 NOTE — ED Notes (Signed)
Pt provided with turkey sandwich and orange juice

## 2021-04-09 NOTE — Progress Notes (Signed)
[facility] PROGRESS NOTE    Progress Note  Jared George  KDT:[id number] DOB: [birth year] DOA: 04/09/2021 PCP: [facility]     Brief Narrative:   Jared George is an 45 y.o. male past medical history of alcohol abuse, essential hypertension was been drinking about half a liter of vodka per day last drink was yesterday at 9 PM woke up feeling unwell and nauseated vomiting, and when he woke up he found his girlfriend did called EMS found him in alcohol withdrawal and hallucinating the ED potassium was 2.4 magnesium 1.2 COVID-positive in June UDS was positive for cannabis who left AGAINST MEDICAL ADVICE on 04/08/2021 at 10 PM return to the hospital at 1 AM 04/10/2019  Assessment/Plan:   Alcohol withdrawal delirium, acute, hyperactive: He is readmitted after leaving AMA brought back to the ED by the police department.   Librium for 3 days and Ativan protocol continue thiamine and folate. CIWA protocol still elevated at 7.  Still having symptoms of formication Allow a diet.  Electrolyte imbalance hypokalemia/hypomagnesemia: Potassium continues to be low replete orally recheck in the morning.  Essential hypertension: Continue on his home dose of hydrochlorothiazide.  Elevated blood glucose: 5.3 blood glucose improved.    DVT prophylaxis: lovenox Family Communication:none Status is: Inpatient  Remains inpatient appropriate because:Hemodynamically unstable  Dispo: The patient is from: Home              Anticipated d/c is to: Home              Patient currently is medically stable to d/c.   Difficult to place patient Yes   Code Status:     Code Status Orders  (From admission, onward)           Start     Ordered   04/07/21 2229  Full code  Continuous        04/07/21 2228           Code Status History     Date Active Date Inactive Code Status Order ID Comments User Context   01/18/2021 2357 01/19/2021 2302 Full Code [id number]  Ward,  Layla Maw, DO ED         IV Access:   Peripheral IV   Procedures and diagnostic studies:   No results found.   Medical Consultants:   None.   Subjective:    Jared George still shaky he relates he feels things crawling up his skin  Objective:    Vitals:   04/09/21 0326 04/09/21 0545 04/09/21 0715 04/09/21 0725  BP: (!) 159/99 (!) 158/97 129/89   Pulse: 86 87 80   Resp: 18 13 15    Temp: 98.9 F (37.2 C)   98.4 F (36.9 C)  TempSrc: Oral   Oral  SpO2: 100% 97% 97%    SpO2: 97 %   Intake/Output Summary (Last 24 hours) at 04/09/2021 0759 Last data filed at 04/09/2021 0725 Gross per 24 hour  Intake 100 ml  Output --  Net 100 ml    There were no vitals filed for this visit.   Exam: General exam: In no acute distress. Respiratory system: Good air movement and clear to auscultation. Cardiovascular system: S1 & S2 heard, RRR. No JVD. Gastrointestinal system: Abdomen is nondistended, soft and nontender.  Extremities: No pedal edema. Skin: No rashes, lesions or ulcers  Data Reviewed:    Labs: Basic Metabolic Panel: Recent Labs  Lab 04/07/21 1735 04/08/21 0418 04/09/21 0154 04/09/21 0600  NA 137 138 138 138  K 2.4* 2.7* 2.9* 3.0*  CL 96* 103 102 103  CO2 28 25 23 25   GLUCOSE 163* 81 138* 59*  BUN 5* <5* 5* <5*  CREATININE 0.79 0.61 0.83 0.74  CALCIUM 9.2 8.3* 8.6* 8.5*  MG 1.2* 1.5* 1.6*  --   PHOS  --  2.8  --   --     GFR Estimated Creatinine Clearance: 111.8 mL/min (by C-G formula based on SCr of 0.74 mg/dL). Liver Function Tests: Recent Labs  Lab 04/07/21 1735 04/08/21 0418 04/09/21 0154  AST 117* 78* 97*  ALT 58* 44 55*  ALKPHOS 81 71 70  BILITOT 1.3* 1.0 1.2  PROT 7.4 6.5 7.0  ALBUMIN 4.2 3.5 4.1    No results for input(s): LIPASE, AMYLASE in the last 168 hours. No results for input(s): AMMONIA in the last 168 hours. Coagulation profile No results for input(s): INR, PROTIME in the last 168 hours. COVID-19 Labs  No  results for input(s): DDIMER, FERRITIN, LDH, CRP in the last 72 hours.  Lab Results  Component Value Date   SARSCOV2NAA NEGATIVE 04/07/2021   SARSCOV2NAA POSITIVE (A) 02/06/2021   SARSCOV2NAA NEGATIVE 01/19/2021   SARSCOV2NAA NEGATIVE 07/28/2019    CBC: Recent Labs  Lab 04/07/21 1735 04/08/21 0418 04/09/21 0154 04/09/21 0600  WBC 6.5 3.4* 6.4 5.4  NEUTROABS 5.6  --  5.0  --   HGB 14.5 14.2 14.2 13.7  HCT 42.1 40.5 41.5 39.6  MCV 89.6 89.4 91.2 91.0  PLT 142* PLATELET CLUMPS NOTED ON SMEAR, UNABLE TO ESTIMATE 96* 87*    Cardiac Enzymes: No results for input(s): CKTOTAL, CKMB, CKMBINDEX, TROPONINI in the last 168 hours. BNP (last 3 results) No results for input(s): PROBNP in the last 8760 hours. CBG: No results for input(s): GLUCAP in the last 168 hours. D-Dimer: No results for input(s): DDIMER in the last 72 hours. Hgb A1c: Recent Labs    04/08/21 0418  HGBA1C 5.3   Lipid Profile: No results for input(s): CHOL, HDL, LDLCALC, TRIG, CHOLHDL, LDLDIRECT in the last 72 hours. Thyroid function studies: No results for input(s): TSH, T4TOTAL, T3FREE, THYROIDAB in the last 72 hours.  Invalid input(s): FREET3 Anemia work up: No results for input(s): VITAMINB12, FOLATE, FERRITIN, TIBC, IRON, RETICCTPCT in the last 72 hours. Sepsis Labs: Recent Labs  Lab 04/07/21 1735 04/08/21 0418 04/09/21 0154 04/09/21 0600  WBC 6.5 3.4* 6.4 5.4    Microbiology Recent Results (from the past 240 hour(s))  Resp Panel by RT-PCR (Flu A&B, Covid) Nasopharyngeal Swab     Status: None   Collection Time: 04/07/21  9:17 PM   Specimen: Nasopharyngeal Swab; Nasopharyngeal(NP) swabs in vial transport medium  Result Value Ref Range Status   SARS Coronavirus 2 by RT PCR NEGATIVE NEGATIVE Final    Comment: (NOTE) SARS-CoV-2 target nucleic acids are NOT DETECTED.  The SARS-CoV-2 RNA is generally detectable in upper respiratory specimens during the acute phase of infection. The  lowest concentration of SARS-CoV-2 viral copies this assay can detect is 138 copies/mL. A negative result does not preclude SARS-Cov-2 infection and should not be used as the sole basis for treatment or other patient management decisions. A negative result may occur with  improper specimen collection/handling, submission of specimen other than nasopharyngeal swab, presence of viral mutation(s) within the areas targeted by this assay, and inadequate number of viral copies(<138 copies/mL). A negative result must be combined with clinical observations, patient history, and epidemiological information. The expected result is  Negative.  Fact Sheet for Patients:  BloggerCourse.com  Fact Sheet for Healthcare Providers:  SeriousBroker.it  This test is no t yet approved or cleared by the [place] FDA and  has been authorized for detection and/or diagnosis of SARS-CoV-2 by FDA under an Emergency Use Authorization (EUA). This EUA will remain  in effect (meaning this test can be used) for the duration of the COVID-19 declaration under Section 564(b)(1) of the Act, 21 U.S.C.section 360bbb-3(b)(1), unless the authorization is terminated  or revoked sooner.       Influenza A by PCR NEGATIVE NEGATIVE Final   Influenza B by PCR NEGATIVE NEGATIVE Final    Comment: (NOTE) The Xpert Xpress SARS-CoV-2/FLU/RSV plus assay is intended as an aid in the diagnosis of influenza from Nasopharyngeal swab specimens and should not be used as a sole basis for treatment. Nasal washings and aspirates are unacceptable for Xpert Xpress SARS-CoV-2/FLU/RSV testing.  Fact Sheet for Patients: BloggerCourse.com  Fact Sheet for Healthcare Providers: SeriousBroker.it  This test is not yet approved or cleared by the [place] FDA and has been authorized for detection and/or diagnosis of SARS-CoV-2 by FDA under  an Emergency Use Authorization (EUA). This EUA will remain in effect (meaning this test can be used) for the duration of the COVID-19 declaration under Section 564(b)(1) of the Act, 21 U.S.C. section 360bbb-3(b)(1), unless the authorization is terminated or revoked.  Performed at [facility], [address], [place], [place] [address]      Medications:    chlordiazePOXIDE  10 mg Oral TID   enoxaparin (LOVENOX) injection  40 mg Subcutaneous Daily   folic acid  1 mg Oral Daily   folic acid  1 mg Oral Daily   LORazepam  0-4 mg Intravenous Q6H   Or   LORazepam  0-4 mg Oral Q6H   [START ON 04/11/2021] LORazepam  0-4 mg Intravenous Q12H   Or   [START ON 04/11/2021] LORazepam  0-4 mg Oral Q12H   losartan  50 mg Oral Daily   multivitamin with minerals  1 tablet Oral Daily   multivitamin with minerals  1 tablet Oral Daily   nicotine  21 mg Transdermal Once   thiamine  100 mg Oral Daily   Or   thiamine  100 mg Intravenous Daily   thiamine  100 mg Oral Daily   Or   thiamine  100 mg Intravenous Daily   thiamine  100 mg Oral Daily   Continuous Infusions:  lactated ringers 75 mL/hr at 04/09/21 0725   potassium chloride 10 mEq (04/09/21 0726)      LOS: 0 days   Marinda Elk  [facility]  04/09/2021, 7:59 AM      '

## 2021-04-09 NOTE — Plan of Care (Signed)
  Problem: Education: Goal: Knowledge of General Education information will improve Description: Including pain rating scale, medication(s)/side effects and non-pharmacologic comfort measures Outcome: Completed/Met

## 2021-04-09 NOTE — ED Triage Notes (Signed)
Pt here w Jared George, c/o alcohol withdrawal. Pt was admitted & upstairs earlier this evening, admitted for tx of withdrawal. Pt left AMA for cigarette, was picked up & taken to jail. Jail refused pt due to severity of pt's withdrawal symptoms. Pt voiced understanding of necessity of treatment, wants to stay for treatment.

## 2021-04-09 NOTE — Plan of Care (Signed)
  Problem: Clinical Measurements: Goal: Respiratory complications will improve Outcome: Progressing   Problem: Clinical Measurements: Goal: Cardiovascular complication will be avoided Outcome: Progressing   Problem: Nutrition: Goal: Adequate nutrition will be maintained Outcome: Progressing   

## 2021-04-09 NOTE — ED Provider Notes (Signed)
Emergency Medicine Provider Triage Evaluation Note  Jared George , a 45 y.o. male  was evaluated in triage.  Pt complains of alcohol withdrawal.  Per the record, patient was admitted yesterday for alcohol withdrawal and high risk of complications including seizures.  The record indicates that patient became frustrated at the left AMA after discussing AMA discharge with both the nurse and the physician via phone.  Patient here in the emergency department states he simply went outside for a cigarette and met the stairs deputy.  Patient is currently in custody.  Reports he will stay for additional treatment.  CIWA 27.  Review of Systems  Positive: Headache, nausea, tremors, itching, pins-and-needles Negative: Hallucinations  Physical Exam  BP (!) 135/109 (BP Location: Right Arm)   Pulse (!) 123   Temp 98.5 F (36.9 C)   Resp 18   SpO2 99%  Gen:   Awake, no distress, diaphoretic Resp:  Normal effort  MSK:   Moves extremities without difficulty  Other:  Anxious, tachycardic  Medical Decision Making  Medically screening exam initiated at 1:27 AM.  Appropriate orders placed.  Jared George was informed that the remainder of the evaluation will be completed by another provider, this initial triage assessment does not replace that evaluation, and the importance of remaining in the ED until their evaluation is complete.  Acute alcohol withdrawal will need readmission.   Muthersbaugh, Hannah, PA-C 04/09/21 0131    Pollina, Christopher J, MD 04/09/21 0632  

## 2021-04-09 NOTE — ED Provider Notes (Signed)
[facility] EMERGENCY DEPARTMENT Provider Note   CSN: [id number] Arrival date & time: 04/09/21  3474     History Chief Complaint  Patient presents with   Needs Detox    Jared George is a 45 y.o. male.  The history is provided by the patient and medical records.  Jared George is a 45 y.o. male who presents to the Emergency Department complaining of alcohol withdrawal. He presents the emergency department and police custody for evaluation of alcohol withdrawal. He was admitted to the hospital yesterday for alcohol withdrawal and he left the hospital to smoke a cigarette. That was around 1050 Monday night. Patient was too symptomatic to be placed in custody and he was returned to the emergency department for further evaluation. Patient reports feeling unwell with nausea, tremors. 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 Last labs at time of admission were significant for hypokalemia and hypomag. Last drink was two days ago.    Past Medical History:  Diagnosis Date   Alcohol abuse    Hypertension     Patient Active Problem List   Diagnosis Date Noted   Essential hypertension 04/09/2021   Alcohol withdrawal delirium, acute, hyperactive (HCC) 04/07/2021   Hypokalemia    Hypomagnesemia    Alcohol use with uncomplicated intoxication (HCC) 01/19/2021   Alcohol use disorder, severe, dependence (HCC) 01/19/2021   Alcohol abuse 01/17/2021   Alcohol withdrawal (HCC) 01/17/2021    Past Surgical History:  Procedure Laterality Date   APPENDECTOMY N/A    HAND SURGERY Left        Family History  Family history unknown: Yes    Social History   Tobacco Use   Smoking status: Heavy Smoker   Smokeless tobacco: Never  Substance Use Topics   Alcohol use: Yes   Drug use: No    Home Medications Prior to Admission medications   Medication Sig Start Date End Date Taking? Authorizing Provider  aspirin 325 MG tablet Take 650 mg by mouth every 4  (four) hours as needed for mild pain, fever or headache.   Yes [provider]  polyvinyl alcohol (LIQUIFILM TEARS) 1.4 % ophthalmic solution Place 1 drop into both eyes as needed for dry eyes.   Yes [provider]  gabapentin (NEURONTIN) 300 MG capsule Take 1 capsule (300 mg total) by mouth 4 (four) times daily. Patient not taking: No sig reported 01/19/21   Charm Rings, NP  ondansetron (ZOFRAN-ODT) 4 MG disintegrating tablet Take 1 tablet (4 mg total) by mouth every 8 (eight) hours as needed for nausea or vomiting. Patient not taking: No sig reported 02/06/21   Kem Boroughs B, FNP    Allergies    Patient has no known allergies.  Review of Systems   Review of Systems  All other systems reviewed and are negative.  Physical Exam Updated Vital Signs BP (!) 158/97   Pulse 87   Temp 98.9 F (37.2 C) (Oral)   Resp 13   SpO2 97%   Physical Exam Vitals and nursing note reviewed.  Constitutional:      Appearance: He is well-developed.  HENT:     Head: Normocephalic and atraumatic.  Cardiovascular:     Rate and Rhythm: Regular rhythm. Tachycardia present.     Heart sounds: No murmur heard. Pulmonary:     Effort: Pulmonary effort is normal. No respiratory distress.     Breath sounds: Normal breath sounds.  Abdominal:     Palpations: Abdomen  is soft.     Tenderness: There is no abdominal tenderness. There is no guarding or rebound.  Musculoskeletal:        General: No tenderness.  Skin:    General: Skin is warm and dry.  Neurological:     Mental Status: He is alert and oriented to person, place, and time.     Comments: Resting tremors.   Psychiatric:     Comments: Anxious appearing    ED Results / Procedures / Treatments   Labs (all labs ordered are listed, but only abnormal results are displayed) Labs Reviewed  COMPREHENSIVE METABOLIC PANEL - Abnormal; Notable for the following components:      Result Value   Potassium 2.9 (*)    Glucose, Bld 138  (*)    BUN 5 (*)    Calcium 8.6 (*)    AST 97 (*)    ALT 55 (*)    All other components within normal limits  CBC WITH DIFFERENTIAL/PLATELET - Abnormal; Notable for the following components:   Platelets 96 (*)    All other components within normal limits  MAGNESIUM - Abnormal; Notable for the following components:   Magnesium 1.6 (*)    All other components within normal limits  BASIC METABOLIC PANEL  CBC    EKG None  Radiology No results found.  Procedures Procedures   Medications Ordered in ED Medications  LORazepam (ATIVAN) injection 0-4 mg (2 mg Intravenous Given 04/09/21 0557)    Or  LORazepam (ATIVAN) tablet 0-4 mg ( Oral See Alternative 04/09/21 0557)  LORazepam (ATIVAN) injection 0-4 mg (has no administration in time range)    Or  LORazepam (ATIVAN) tablet 0-4 mg (has no administration in time range)  thiamine tablet 100 mg (has no administration in time range)    Or  thiamine (B-1) injection 100 mg (has no administration in time range)  nicotine (NICODERM CQ - dosed in mg/24 hours) patch 21 mg (21 mg Transdermal Patch Applied 04/09/21 0207)  LORazepam (ATIVAN) tablet 1-4 mg (has no administration in time range)    Or  LORazepam (ATIVAN) injection 1-4 mg (has no administration in time range)  thiamine tablet 100 mg (has no administration in time range)    Or  thiamine (B-1) injection 100 mg (has no administration in time range)  folic acid (FOLVITE) tablet 1 mg (has no administration in time range)  multivitamin with minerals tablet 1 tablet (has no administration in time range)  enoxaparin (LOVENOX) injection 40 mg (has no administration in time range)  potassium chloride 10 mEq in 100 mL IVPB (10 mEq Intravenous New Bag/Given 04/09/21 0525)  lactated ringers infusion ( Intravenous New Bag/Given 04/09/21 0524)  acetaminophen (TYLENOL) tablet 650 mg (has no administration in time range)    Or  acetaminophen (TYLENOL) suppository 650 mg (has no administration in time  range)  senna-docusate (Senokot-S) tablet 1 tablet (has no administration in time range)  folic acid (FOLVITE) tablet 1 mg (has no administration in time range)  multivitamin with minerals tablet 1 tablet (has no administration in time range)  thiamine tablet 100 mg (has no administration in time range)  losartan (COZAAR) tablet 50 mg (50 mg Oral Given 04/09/21 0518)  chlordiazePOXIDE (LIBRIUM) capsule 10 mg (has no administration in time range)  acetaminophen (TYLENOL) tablet 650 mg (650 mg Oral Given 04/09/21 0335)  magnesium sulfate IVPB 2 g 50 mL (0 g Intravenous Stopped 04/09/21 0454)  potassium chloride SA (KLOR-CON) CR tablet 40 mEq (40 mEq Oral  Given 04/09/21 0335)  LORazepam (ATIVAN) injection 2 mg (2 mg Intravenous Given 04/09/21 0334)  magnesium sulfate IVPB 2 g 50 mL (0 g Intravenous Stopped 04/09/21 0608)    ED Course  I have reviewed the triage vital signs and the nursing notes.  Pertinent labs & imaging results that were available during my care of the patient were reviewed by me and considered in my medical decision making (see chart for details).    MDM Rules/Calculators/A&P                          patient with history of alcohol abuse here for evaluation of alcohol withdrawal. He left the hospital AMA a few hours previously. On evaluation patient is tachycardic, tremulous consistent with acute withdrawal. He does have hypokalemia, hyper magnesemia on labs this evening. Will replete electrolytes and treat with Ativan for alcohol withdrawal. Medicine consulted for admission.  Final Clinical Impression(s) / ED Diagnoses Final diagnoses:  Alcohol withdrawal syndrome with complication (HCC)  Hypomagnesemia  Hypokalemia    Rx / DC Orders ED Discharge Orders     None        Tilden Fossa, MD 04/09/21 [phone]

## 2021-04-09 NOTE — H&P (Signed)
History and Physical    Jared George EXH:[id number] DOB: 05/01/1976 DOA: 04/09/2021  PCP: [facility]   Patient coming from: Brought in by police department after leaving AMA last night.   Chief Complaint: alcohol withdrawal.  HPI: Jared George is a 45 y.o. male with medical history significant for AUD, HTN who presented for alcohol withdrawal symptoms on 04/07/21. He was admitted and subsequently left AMA on evening of 04/08/21. He was brought back by police department as he had outstanding warrents and was taken to jail but then developed withdrawal symptoms so brought back to ER. He was found to have hypokalemia when admitted and was given some potassium and magnesium. Upon return he continues to have low potassium and magnesium levels.  Reports that he drinks 1/2-3/4 of a liter of vodka per day.  He last drink 3 days ago.  Orts that on the morning of April 07, 2021 he woke up and his girlfriend was dead in the bed next to him.  He had called EMS and the police but they were unable to revive his girlfriend and patient was brought to the hospital for alcohol withdrawal which led to his first admission.  He states he has had alcohol withdrawal with hallucinations and seizures in the past.  He states he is having intermittent auditory and visual hallucinations over the past 24 hours. States he has not had any seizure activity recently.  ED Course: He has continued to have elevated blood pressure in the emergency room.  He initially had some tachycardia which is now improved.  Labs this morning showed a sodium of 138, potassium 2.9, chloride 102, bicarb 23, glucose 138, creatinine 0.83, BUN 5, magnesium 1.6, alkaline phosphatase 70, AST 97, ALT 55.  CBC unremarkable except for platelet count of 96,000.  She has no bleeding or bruising.  Hospitalist service is asked to readmit patient for alcohol withdrawals and his hypokalemia  Review of Systems:  General: Denies fever, chills,  weight loss, night sweats. Denies dizziness. Denies change in appetite HENT: Denies head trauma, headache, denies change in hearing, tinnitus. Denies nasal congestion.  Denies sore throat, Denies difficulty swallowing Eyes: Denies blurry vision, pain in eye, drainage. Denies discoloration of eyes. Neck: Denies pain.  Denies swelling.  Denies pain with movement. Cardiovascular: Denies chest pain, palpitations.  Denies edema.  Denies orthopnea Respiratory: Denies shortness of breath, cough.  Denies wheezing.  Denies sputum production Gastrointestinal: Denies abdominal pain, swelling. Denies nausea, vomiting, diarrhea.  Denies melena.  Denies hematemesis. Musculoskeletal: Denies limitation of movement.  Denies deformity or swelling.  Denies pain.  Denies arthralgias or myalgias. Genitourinary: Denies pelvic pain.  Denies urinary frequency or hesitancy.  Denies dysuria.  Skin: Denies rash.  Denies petechiae, purpura, ecchymosis. Neurological: Denies syncope. Denies paresthesia.  Denies slurred speech, drooping face.  Denies visual change. Psychiatric: Denies suicidal thoughts or ideation. Reports occasional auditory and visual hallucinations.  Past Medical History:  Diagnosis Date   Alcohol abuse    Hypertension     Past Surgical History:  Procedure Laterality Date   APPENDECTOMY N/A    HAND SURGERY Left     Social History  reports that he has been smoking. He has never used smokeless tobacco. He reports current alcohol use. He reports that he does not use drugs.  No Known Allergies  Family History  Family history unknown: Yes     Prior to Admission medications   Medication Sig Start Date End Date Taking? Authorizing Provider  aspirin 325 MG tablet Take 650 mg by mouth every 4 (four) hours as needed for mild pain, fever or headache.   Yes [provider]  polyvinyl alcohol (LIQUIFILM TEARS) 1.4 % ophthalmic solution Place 1 drop into both eyes as needed for dry eyes.   Yes  [provider]  gabapentin (NEURONTIN) 300 MG capsule Take 1 capsule (300 mg total) by mouth 4 (four) times daily. Patient not taking: No sig reported 01/19/21   Charm Rings, NP  ondansetron (ZOFRAN-ODT) 4 MG disintegrating tablet Take 1 tablet (4 mg total) by mouth every 8 (eight) hours as needed for nausea or vomiting. Patient not taking: No sig reported 02/06/21   Chinita Pester, FNP    Physical Exam: Vitals:   04/09/21 0108 04/09/21 0125 04/09/21 0326  BP: (!) 135/109 (!) 135/109 (!) 159/99  Pulse: (!) 123 (!) 123 86  Resp: 18  18  Temp: 98.5 F (36.9 C)  98.9 F (37.2 C)  TempSrc:   Oral  SpO2: 99%  100%    Constitutional: NAD, calm, comfortable Vitals:   04/09/21 0108 04/09/21 0125 04/09/21 0326  BP: (!) 135/109 (!) 135/109 (!) 159/99  Pulse: (!) 123 (!) 123 86  Resp: 18  18  Temp: 98.5 F (36.9 C)  98.9 F (37.2 C)  TempSrc:   Oral  SpO2: 99%  100%   General: WDWN, Alert and oriented x3. Anxious Eyes: EOMI, PERRL, conjunctivae normal.  Sclera nonicteric HENT:  [place]/AT, external ears normal.  Nares patent without epistasis.  Mucous membranes are moist.  Neck: Soft, normal range of motion, supple, no masses, Trachea midline Respiratory: clear to auscultation bilaterally, no wheezing, no crackles. Normal respiratory effort. No accessory muscle use.  Cardiovascular: Regular rate and rhythm, no murmurs / rubs / gallops. No edema of legs. 2+ pedal pulses.  Abdomen: Soft, no tenderness, nondistended, no rebound or guarding.  No masses palpated. No hepatosplenomegaly. Bowel sounds normoactive Musculoskeletal: FROM. no cyanosis. No joint deformity upper and lower extremities. Normal muscle tone.  Skin: Warm, dry, intact no rashes, lesions, ulcers. No induration. Multiple tattoos  Neurologic: CN 2-12 grossly intact. Normal speech. Sensation intact to touch, patella DTR +1 bilaterally. Strength 5/5 in all extremities.   Psychiatric: Normal judgment and insight.   Anxious mood.    Labs on Admission: I have personally reviewed following labs and imaging studies  CBC: Recent Labs  Lab 04/07/21 1735 04/08/21 0418 04/09/21 0154  WBC 6.5 3.4* 6.4  NEUTROABS 5.6  --  5.0  HGB 14.5 14.2 14.2  HCT 42.1 40.5 41.5  MCV 89.6 89.4 91.2  PLT 142* PLATELET CLUMPS NOTED ON SMEAR, UNABLE TO ESTIMATE 96*    Basic Metabolic Panel: Recent Labs  Lab 04/07/21 1735 04/08/21 0418 04/09/21 0154  NA 137 138 138  K 2.4* 2.7* 2.9*  CL 96* 103 102  CO2 28 25 23   GLUCOSE 163* 81 138*  BUN 5* <5* 5*  CREATININE 0.79 0.61 0.83  CALCIUM 9.2 8.3* 8.6*  MG 1.2* 1.5* 1.6*  PHOS  --  2.8  --     GFR: Estimated Creatinine Clearance: 107.8 mL/min (by C-G formula based on SCr of 0.83 mg/dL).  Liver Function Tests: Recent Labs  Lab 04/07/21 1735 04/08/21 0418 04/09/21 0154  AST 117* 78* 97*  ALT 58* 44 55*  ALKPHOS 81 71 70  BILITOT 1.3* 1.0 1.2  PROT 7.4 6.5 7.0  ALBUMIN 4.2 3.5 4.1    Urine analysis:    Component  Value Date/Time   COLORURINE YELLOW 04/07/2021 1735   APPEARANCEUR CLEAR 04/07/2021 1735   LABSPEC 1.015 04/07/2021 1735   PHURINE 9.0 (H) 04/07/2021 1735   GLUCOSEU 150 (A) 04/07/2021 1735   HGBUR NEGATIVE 04/07/2021 1735   BILIRUBINUR NEGATIVE 04/07/2021 1735   KETONESUR NEGATIVE 04/07/2021 1735   PROTEINUR 100 (A) 04/07/2021 1735   NITRITE NEGATIVE 04/07/2021 1735   LEUKOCYTESUR NEGATIVE 04/07/2021 1735    Radiological Exams on Admission: No results found.  EKG: Independently reviewed.  EKG shows normal sinus rhythm with no acute ST elevation or depression.  QTc 462  Assessment/Plan Principal Problem:   Alcohol withdrawal  Mr. Savitz is readmitted after leaving AMA last pm. He was brought back to ER by the police department and is now under police custody and has police officer in room with him. On CIWA protocol. Ativan provided for elevated CIWA scores.  Start Librium Received Banana bag yesterday in Er. Continue with  daily MVI, folic acid and thiamine.   Active Problems:   Hypokalemia Supplemental potassium provided. Monitor on telemetry.  Check electrolytes and renal function in am    Hypomagnesemia Magnesium repleted. Recheck magnesium level tomorrow am.    Essential hypertension Start Losartan for HTN. Monitor potassium level. Will need follow up for BP management after discharge.     DVT prophylaxis: Lovenox for DVT prophylaxis.  Code Status:   Full code  Family Communication:  Diagnosis and plan discussed with patient.  He is in agreement with admission and states he will stay he did sign out AMA earlier and was brought back by the police and is now under police custody officer sitting in the room with him Disposition Plan:   Patient is from:  Home  Anticipated DC to:  [facility]   Anticipated DC date:  Anticipate greater than 2 midnight stay    Admission status:  Inpatient. Currently under police custody.  Claudean Severance Neven Fina MD [facility]  How to contact the [facility] Attending or Consulting provider 7A - 7P or covering provider during after hours 7P -7A, for this patient?   Check the care team in [facility] and look for a) attending/consulting [facility] provider listed and b) the [facility] team listed Log into [email or web address] and use [place]'s universal password to access. If you do not have the password, please contact the hospital operator. Locate the [facility] provider you are looking for under [facility] and page to a number that you can be directly reached. If you still have difficulty reaching the provider, please page the [facility] (Director on Call) for the Hospitalists listed on amion for assistance.  04/09/2021, 5:00 AM

## 2021-04-10 ENCOUNTER — Other Ambulatory Visit: Payer: Self-pay

## 2021-04-10 DIAGNOSIS — F10230 Alcohol dependence with withdrawal, uncomplicated: Secondary | ICD-10-CM | POA: Diagnosis not present

## 2021-04-10 DIAGNOSIS — E876 Hypokalemia: Secondary | ICD-10-CM | POA: Diagnosis not present

## 2021-04-10 DIAGNOSIS — I10 Essential (primary) hypertension: Secondary | ICD-10-CM | POA: Diagnosis not present

## 2021-04-10 LAB — BASIC METABOLIC PANEL
Anion gap: 11 (ref 5–15)
BUN: 5 mg/dL — ABNORMAL LOW (ref 6–20)
CO2: 20 mmol/L — ABNORMAL LOW (ref 22–32)
Calcium: 8.2 mg/dL — ABNORMAL LOW (ref 8.9–10.3)
Chloride: 105 mmol/L (ref 98–111)
Creatinine, Ser: 0.61 mg/dL (ref 0.61–1.24)
GFR, Estimated: >60 mL/min (ref >60)
Glucose, Bld: 87 mg/dL (ref 70–99)
Potassium: 3.3 mmol/L — ABNORMAL LOW (ref 3.5–5.1)
Sodium: 136 mmol/L (ref 135–145)

## 2021-04-10 LAB — MAGNESIUM
Magnesium: 1.9 mg/dL (ref 1.7–2.4)
Magnesium: 1.8 mg/dL (ref 1.7–2.4)

## 2021-04-10 MED ORDER — DIPHENOXYLATE-ATROPINE 2.5-0.025 MG PO TABS
1.0000 | ORAL_TABLET | Freq: Two times a day (BID) | ORAL | Status: DC
  Administered 2021-04-10 – 2021-04-13 (×6): 1 via ORAL
  Filled 2021-04-10 (×7): qty 1

## 2021-04-10 MED ORDER — POTASSIUM CHLORIDE CRYS ER 20 MEQ PO TBCR
40.0000 meq | EXTENDED_RELEASE_TABLET | ORAL | Status: AC
Start: 2021-04-10 — End: 2021-04-11
  Administered 2021-04-10: 40 meq via ORAL
  Filled 2021-04-10 (×2): qty 2

## 2021-04-10 MED ORDER — CLONIDINE HCL 0.1 MG PO TABS
0.1000 mg | ORAL_TABLET | Freq: Three times a day (TID) | ORAL | Status: DC
  Administered 2021-04-10 – 2021-04-14 (×12): 0.1 mg via ORAL
  Filled 2021-04-10 (×12): qty 1

## 2021-04-10 NOTE — Progress Notes (Signed)
PROGRESS NOTE    Jared George   MWN:[id number]  DOB: 05-10-1976  DOA: 04/09/2021 [facility]: [facility]   Brief Narrative:  Jared Hubbert Masseyis a 45 y.o. male with medical history significant for AUD, HTN who presented for alcohol withdrawal symptoms on 04/07/21. He was admitted and subsequently left AMA on evening of 04/08/21. He was brought back by police department as he had outstanding warrents and was taken to jail but then developed withdrawal symptoms so brought back to ER. He was found to have hypokalemia when admitted and was given some potassium and magnesium. Upon return he continues to have low potassium and magnesium levels.  Reports that he drinks 1/2-3/4 of a liter of vodka per day.  He last drink 3 days ago.  Orts that on the morning of April 07, 2021 he woke up and his girlfriend was dead in the bed next to him.  He had called EMS and the police but they were unable to revive his girlfriend and patient was brought to the hospital for alcohol withdrawal which led to his first admission.  He states he has had alcohol withdrawal with hallucinations and seizures in the past.  He states he is having intermittent auditory and visual hallucinations over the past 24 hours. States he has not had any seizure activity recently.   Subjective: Having tremors and ongoing hallucinations. Also having diarrhea.     Assessment & Plan:   Principal Problem:   Alcohol withdrawal (HCC) -cont CIWA - add Clonidine for tremors  Active Problems:   Hypokalemia   Hypomagnesemia - replacing  Diarrhea - start Lomotil     Essential hypertension - add Clonidine  Time spent in minutes: 35 DVT prophylaxis: enoxaparin (LOVENOX) injection 40 mg Start: 04/09/21 1000  Code Status: Full code Family Communication:  Level of Care: Level of care: Telemetry Medical Disposition Plan:  Status is: Inpatient  Remains inpatient appropriate because:IV treatments appropriate due to intensity  of illness or inability to take PO  Dispo: The patient is from: Home              Anticipated d/c is to: Home              Patient currently is not medically stable to d/c.   Difficult to place patient No      Consultants:  none Procedures:  none Antimicrobials:  Anti-infectives (From admission, onward)    None        Objective: Vitals:   04/10/21 0311 04/10/21 0312 04/10/21 0426 04/10/21 0831  BP:   (!) 155/119 (!) 145/107  Pulse: (!) 130 100 (!) 107 93  Resp:   20 20  Temp:   97.9 F (36.6 C) 98.1 F (36.7 C)  TempSrc:   Oral Oral  SpO2:   100% 99%    Intake/Output Summary (Last 24 hours) at 04/10/2021 1624 Last data filed at 04/10/2021 0907 Gross per 24 hour  Intake 2427.8 ml  Output 1275 ml  Net 1152.8 ml   There were no vitals filed for this visit.  Examination: General exam: Appears comfortable  HEENT: PERRLA, oral mucosa moist, no sclera icterus or thrush Respiratory system: Clear to auscultation. Respiratory effort normal. Cardiovascular system: S1 & S2 heard, RRR.   Gastrointestinal system: Abdomen soft, non-tender, nondistended. Normal bowel sounds. Central nervous system: Alert and oriented. No focal neurological deficits. Extremities: No cyanosis, clubbing or edema- tremors noted Skin: No rashes or ulcers Psychiatry:  Mood & affect appropriate.  Data Reviewed: I have personally reviewed following labs and imaging studies  CBC: Recent Labs  Lab 04/07/21 1735 04/08/21 0418 04/09/21 0154 04/09/21 0600  WBC 6.5 3.4* 6.4 5.4  NEUTROABS 5.6  --  5.0  --   HGB 14.5 14.2 14.2 13.7  HCT 42.1 40.5 41.5 39.6  MCV 89.6 89.4 91.2 91.0  PLT 142* PLATELET CLUMPS NOTED ON SMEAR, UNABLE TO ESTIMATE 96* 87*   Basic Metabolic Panel: Recent Labs  Lab 04/07/21 1735 04/08/21 0418 04/09/21 0154 04/09/21 0600 04/10/21 0332 04/10/21 0813  NA 137 138 138 138  --  136  K 2.4* 2.7* 2.9* 3.0*  --  3.3*  CL 96* 103 102 103  --  105  CO2 28 25 23 25    --  20*  GLUCOSE 163* 81 138* 59*  --  87  BUN 5* <5* 5* <5*  --  5*  CREATININE 0.79 0.61 0.83 0.74  --  0.61  CALCIUM 9.2 8.3* 8.6* 8.5*  --  8.2*  MG 1.2* 1.5* 1.6*  --  1.9 1.8  PHOS  --  2.8  --   --   --   --    GFR: Estimated Creatinine Clearance: 111.8 mL/min (by C-G formula based on SCr of 0.61 mg/dL). Liver Function Tests: Recent Labs  Lab 04/07/21 1735 04/08/21 0418 04/09/21 0154  AST 117* 78* 97*  ALT 58* 44 55*  ALKPHOS 81 71 70  BILITOT 1.3* 1.0 1.2  PROT 7.4 6.5 7.0  ALBUMIN 4.2 3.5 4.1   No results for input(s): LIPASE, AMYLASE in the last 168 hours. No results for input(s): AMMONIA in the last 168 hours. Coagulation Profile: No results for input(s): INR, PROTIME in the last 168 hours. Cardiac Enzymes: No results for input(s): CKTOTAL, CKMB, CKMBINDEX, TROPONINI in the last 168 hours. BNP (last 3 results) No results for input(s): PROBNP in the last 8760 hours. HbA1C: Recent Labs    04/08/21 0418  HGBA1C 5.3   CBG: Recent Labs  Lab 04/09/21 1756  GLUCAP 103*   Lipid Profile: No results for input(s): CHOL, HDL, LDLCALC, TRIG, CHOLHDL, LDLDIRECT in the last 72 hours. Thyroid Function Tests: No results for input(s): TSH, T4TOTAL, FREET4, T3FREE, THYROIDAB in the last 72 hours. Anemia Panel: No results for input(s): VITAMINB12, FOLATE, FERRITIN, TIBC, IRON, RETICCTPCT in the last 72 hours. Urine analysis:    Component Value Date/Time   COLORURINE YELLOW 04/07/2021 1735   APPEARANCEUR CLEAR 04/07/2021 1735   LABSPEC 1.015 04/07/2021 1735   PHURINE 9.0 (H) 04/07/2021 1735   GLUCOSEU 150 (A) 04/07/2021 1735   HGBUR NEGATIVE 04/07/2021 1735   BILIRUBINUR NEGATIVE 04/07/2021 1735   KETONESUR NEGATIVE 04/07/2021 1735   PROTEINUR 100 (A) 04/07/2021 1735   NITRITE NEGATIVE 04/07/2021 1735   LEUKOCYTESUR NEGATIVE 04/07/2021 1735   Sepsis Labs: @LABRCNTIP (procalcitonin:4,lacticidven:4) ) Recent Results (from the past 240 hour(s))  Resp Panel by  RT-PCR (Flu A&B, Covid) Nasopharyngeal Swab     Status: None   Collection Time: 04/07/21  9:17 PM   Specimen: Nasopharyngeal Swab; Nasopharyngeal(NP) swabs in vial transport medium  Result Value Ref Range Status   SARS Coronavirus 2 by RT PCR NEGATIVE NEGATIVE Final    Comment: (NOTE) SARS-CoV-2 target nucleic acids are NOT DETECTED.  The SARS-CoV-2 RNA is generally detectable in upper respiratory specimens during the acute phase of infection. The lowest concentration of SARS-CoV-2 viral copies this assay can detect is 138 copies/mL. A negative result does not preclude SARS-Cov-2 infection and should not  be used as the sole basis for treatment or other patient management decisions. A negative result may occur with  improper specimen collection/handling, submission of specimen other than nasopharyngeal swab, presence of viral mutation(s) within the areas targeted by this assay, and inadequate number of viral copies(<138 copies/mL). A negative result must be combined with clinical observations, patient history, and epidemiological information. The expected result is Negative.  Fact Sheet for Patients:  BloggerCourse.com  Fact Sheet for Healthcare Providers:  SeriousBroker.it  This test is no t yet approved or cleared by the [place] FDA and  has been authorized for detection and/or diagnosis of SARS-CoV-2 by FDA under an Emergency Use Authorization (EUA). This EUA will remain  in effect (meaning this test can be used) for the duration of the COVID-19 declaration under Section 564(b)(1) of the Act, 21 U.S.C.section 360bbb-3(b)(1), unless the authorization is terminated  or revoked sooner.       Influenza A by PCR NEGATIVE NEGATIVE Final   Influenza B by PCR NEGATIVE NEGATIVE Final    Comment: (NOTE) The Xpert Xpress SARS-CoV-2/FLU/RSV plus assay is intended as an aid in the diagnosis of influenza from Nasopharyngeal swab  specimens and should not be used as a sole basis for treatment. Nasal washings and aspirates are unacceptable for Xpert Xpress SARS-CoV-2/FLU/RSV testing.  Fact Sheet for Patients: BloggerCourse.com  Fact Sheet for Healthcare Providers: SeriousBroker.it  This test is not yet approved or cleared by the [place] FDA and has been authorized for detection and/or diagnosis of SARS-CoV-2 by FDA under an Emergency Use Authorization (EUA). This EUA will remain in effect (meaning this test can be used) for the duration of the COVID-19 declaration under Section 564(b)(1) of the Act, 21 U.S.C. section 360bbb-3(b)(1), unless the authorization is terminated or revoked.  Performed at [facility], [address] [place], [place] [address]          Radiology Studies: No results found.    Scheduled Meds:  cloNIDine  0.1 mg Oral TID   diphenoxylate-atropine  1 tablet Oral BID   enoxaparin (LOVENOX) injection  40 mg Subcutaneous Daily   folic acid  1 mg Oral Daily   LORazepam  0-4 mg Intravenous Q6H   Or   LORazepam  0-4 mg Oral Q6H   [START ON 04/11/2021] LORazepam  0-4 mg Intravenous Q12H   Or   [START ON 04/11/2021] LORazepam  0-4 mg Oral Q12H   losartan  50 mg Oral Daily   multivitamin with minerals  1 tablet Oral Daily   nicotine  21 mg Transdermal Daily   thiamine  100 mg Oral Daily   Or   thiamine  100 mg Intravenous Daily   Continuous Infusions:   LOS: 1 day      Calvert Cantor, MD [facility] Pager: [email or web address] 04/10/2021, 4:24 PM

## 2021-04-10 NOTE — Progress Notes (Signed)
New Admission Note:  Arrival Method:Stretcher Mental Orientation: Alert and oriented x 4 Telemetry: Box 5 Assessment: Completed Skin: warm and dry. Tatts IV: NSL Pain: Denies Tubes: N/A Safety Measures: Safety Fall Prevention Plan initiated.  Admission: Completed 5 M  Orientation: Patient has been orientated to the room, unit and the staff. Welcome booklet given.  Family: None  Orders have been reviewed and implemented. Will continue to monitor the patient. Call light has been placed within reach and bed alarm has been activated.   Guilford Shi BSN, RN  Phone Number: [phone]

## 2021-04-11 DIAGNOSIS — F10230 Alcohol dependence with withdrawal, uncomplicated: Secondary | ICD-10-CM | POA: Diagnosis not present

## 2021-04-11 DIAGNOSIS — E876 Hypokalemia: Secondary | ICD-10-CM | POA: Diagnosis not present

## 2021-04-11 DIAGNOSIS — I10 Essential (primary) hypertension: Secondary | ICD-10-CM | POA: Diagnosis not present

## 2021-04-11 LAB — BASIC METABOLIC PANEL
Anion gap: 12 (ref 5–15)
BUN: 5 mg/dL — ABNORMAL LOW (ref 6–20)
CO2: 23 mmol/L (ref 22–32)
Calcium: 8.9 mg/dL (ref 8.9–10.3)
Chloride: 101 mmol/L (ref 98–111)
Creatinine, Ser: 0.62 mg/dL (ref 0.61–1.24)
GFR, Estimated: >60 mL/min (ref >60)
Glucose, Bld: 74 mg/dL (ref 70–99)
Potassium: 3.7 mmol/L (ref 3.5–5.1)
Sodium: 136 mmol/L (ref 135–145)

## 2021-04-11 LAB — MAGNESIUM: Magnesium: 1.7 mg/dL (ref 1.7–2.4)

## 2021-04-11 NOTE — Progress Notes (Signed)
PROGRESS NOTE    Jared George   VXB:[id number]  DOB: 1975-09-21  DOA: 04/09/2021 PCP: [facility]   Brief Narrative:  Jared Ozer Masseyis a 45 y.o. male with medical history significant for AUD, HTN who presented for alcohol withdrawal symptoms on 04/07/21. He was admitted and subsequently left AMA on evening of 04/08/21. He was brought back by police department as he had outstanding warrents and was taken to jail but then developed withdrawal symptoms so brought back to ER. He was found to have hypokalemia when admitted and was given some potassium and magnesium. Upon return he continues to have low potassium and magnesium levels.  Reports that he drinks 1/2-3/4 of a liter of vodka per day.  He last drink 3 days ago.  Reports that on the morning of April 07, 2021 he woke up and his girlfriend was dead in the bed next to him.  He had called EMS and the police but they were unable to revive his girlfriend and patient was brought to the hospital for alcohol withdrawal which led to his first admission.  He states he has had alcohol withdrawal with hallucinations and seizures in the past.  He states he is having intermittent auditory and visual hallucinations over the past 24 hours. States he has not had any seizure activity recently.   Subjective: Tremors improving. No further hallucinations. Still has diarrhea. Trying to eat.     Assessment & Plan:   Principal Problem:   Alcohol withdrawal (HCC) - he drinks 2 bottles of Vodka a day -cont CIWA - added Clonidine for tremors & BP - still in active withdrawal today  Active Problems:   Hypokalemia   Hypomagnesemia - replacing  Diarrhea - started Lomotil PRN- he has taken a dose today    Essential hypertension - added Clonidine  Time spent in minutes: 35 DVT prophylaxis: enoxaparin (LOVENOX) injection 40 mg Start: 04/09/21 1000  Code Status: Full code Family Communication:  Level of Care: Level of care: Telemetry  Medical Disposition Plan:  Status is: Inpatient  Remains inpatient appropriate because:IV treatments appropriate due to intensity of illness or inability to take PO  Dispo: The patient is from: Home              Anticipated d/c is to: Home              Patient currently is not medically stable to d/c.   Difficult to place patient No   Consultants:  none Procedures:  none Antimicrobials:  Anti-infectives (From admission, onward)    None        Objective: Vitals:   04/10/21 1839 04/10/21 2024 04/11/21 0600 04/11/21 0920  BP: (!) 157/104 (!) 163/114 (!) 162/105 (!) 149/95  Pulse: 87 85 86 78  Resp: 18 20 20 17   Temp: 99.1 F (37.3 C) 97.8 F (36.6 C) 98.3 F (36.8 C) 98.9 F (37.2 C)  TempSrc: Oral Oral Oral Oral  SpO2: 98% 99% 98% 100%    Intake/Output Summary (Last 24 hours) at 04/11/2021 1117 Last data filed at 04/11/2021 06/11/2021 Gross per 24 hour  Intake 1557 ml  Output 1350 ml  Net 207 ml    There were no vitals filed for this visit.  Examination: General exam: Appears comfortable  HEENT: PERRLA, oral mucosa moist, no sclera icterus or thrush Respiratory system: Clear to auscultation. Respiratory effort normal. Cardiovascular system: S1 & S2 heard, regular rate and rhythm Gastrointestinal system: Abdomen soft, non-tender, nondistended. Normal bowel  sounds   Central nervous system: Alert and oriented. No focal neurological deficits. Extremities: No cyanosis, clubbing or edema- tremors noted Skin: No rashes or ulcers Psychiatry:  Mood & affect appropriate.      Data Reviewed: I have personally reviewed following labs and imaging studies  CBC: Recent Labs  Lab 04/07/21 1735 04/08/21 0418 04/09/21 0154 04/09/21 0600  WBC 6.5 3.4* 6.4 5.4  NEUTROABS 5.6  --  5.0  --   HGB 14.5 14.2 14.2 13.7  HCT 42.1 40.5 41.5 39.6  MCV 89.6 89.4 91.2 91.0  PLT 142* PLATELET CLUMPS NOTED ON SMEAR, UNABLE TO ESTIMATE 96* 87*    Basic Metabolic Panel: Recent Labs   Lab 04/08/21 0418 04/09/21 0154 04/09/21 0600 04/10/21 0332 04/10/21 0813 04/11/21 0302  NA 138 138 138  --  136 136  K 2.7* 2.9* 3.0*  --  3.3* 3.7  CL 103 102 103  --  105 101  CO2 25 23 25   --  20* 23  GLUCOSE 81 138* 59*  --  87 74  BUN <5* 5* <5*  --  5* 5*  CREATININE 0.61 0.83 0.74  --  0.61 0.62  CALCIUM 8.3* 8.6* 8.5*  --  8.2* 8.9  MG 1.5* 1.6*  --  1.9 1.8 1.7  PHOS 2.8  --   --   --   --   --     GFR: Estimated Creatinine Clearance: 111.8 mL/min (by C-G formula based on SCr of 0.62 mg/dL). Liver Function Tests: Recent Labs  Lab 04/07/21 1735 04/08/21 0418 04/09/21 0154  AST 117* 78* 97*  ALT 58* 44 55*  ALKPHOS 81 71 70  BILITOT 1.3* 1.0 1.2  PROT 7.4 6.5 7.0  ALBUMIN 4.2 3.5 4.1    No results for input(s): LIPASE, AMYLASE in the last 168 hours. No results for input(s): AMMONIA in the last 168 hours. Coagulation Profile: No results for input(s): INR, PROTIME in the last 168 hours. Cardiac Enzymes: No results for input(s): CKTOTAL, CKMB, CKMBINDEX, TROPONINI in the last 168 hours. BNP (last 3 results) No results for input(s): PROBNP in the last 8760 hours. HbA1C: No results for input(s): HGBA1C in the last 72 hours.  CBG: Recent Labs  Lab 04/09/21 1756  GLUCAP 103*    Lipid Profile: No results for input(s): CHOL, HDL, LDLCALC, TRIG, CHOLHDL, LDLDIRECT in the last 72 hours. Thyroid Function Tests: No results for input(s): TSH, T4TOTAL, FREET4, T3FREE, THYROIDAB in the last 72 hours. Anemia Panel: No results for input(s): VITAMINB12, FOLATE, FERRITIN, TIBC, IRON, RETICCTPCT in the last 72 hours. Urine analysis:    Component Value Date/Time   COLORURINE YELLOW 04/07/2021 1735   APPEARANCEUR CLEAR 04/07/2021 1735   LABSPEC 1.015 04/07/2021 1735   PHURINE 9.0 (H) 04/07/2021 1735   GLUCOSEU 150 (A) 04/07/2021 1735   HGBUR NEGATIVE 04/07/2021 1735   BILIRUBINUR NEGATIVE 04/07/2021 1735   KETONESUR NEGATIVE 04/07/2021 1735   PROTEINUR 100 (A)  04/07/2021 1735   NITRITE NEGATIVE 04/07/2021 1735   LEUKOCYTESUR NEGATIVE 04/07/2021 1735   Sepsis Labs: @LABRCNTIP (procalcitonin:4,lacticidven:4) ) Recent Results (from the past 240 hour(s))  Resp Panel by RT-PCR (Flu A&B, Covid) Nasopharyngeal Swab     Status: None   Collection Time: 04/07/21  9:17 PM   Specimen: Nasopharyngeal Swab; Nasopharyngeal(NP) swabs in vial transport medium  Result Value Ref Range Status   SARS Coronavirus 2 by RT PCR NEGATIVE NEGATIVE Final    Comment: (NOTE) SARS-CoV-2 target nucleic acids are NOT DETECTED.  The  SARS-CoV-2 RNA is generally detectable in upper respiratory specimens during the acute phase of infection. The lowest concentration of SARS-CoV-2 viral copies this assay can detect is 138 copies/mL. A negative result does not preclude SARS-Cov-2 infection and should not be used as the sole basis for treatment or other patient management decisions. A negative result may occur with  improper specimen collection/handling, submission of specimen other than nasopharyngeal swab, presence of viral mutation(s) within the areas targeted by this assay, and inadequate number of viral copies(<138 copies/mL). A negative result must be combined with clinical observations, patient history, and epidemiological information. The expected result is Negative.  Fact Sheet for Patients:  BloggerCourse.com  Fact Sheet for Healthcare Providers:  SeriousBroker.it  This test is no t yet approved or cleared by the [place] FDA and  has been authorized for detection and/or diagnosis of SARS-CoV-2 by FDA under an Emergency Use Authorization (EUA). This EUA will remain  in effect (meaning this test can be used) for the duration of the COVID-19 declaration under Section 564(b)(1) of the Act, 21 U.S.C.section 360bbb-3(b)(1), unless the authorization is terminated  or revoked sooner.       Influenza A by PCR  NEGATIVE NEGATIVE Final   Influenza B by PCR NEGATIVE NEGATIVE Final    Comment: (NOTE) The Xpert Xpress SARS-CoV-2/FLU/RSV plus assay is intended as an aid in the diagnosis of influenza from Nasopharyngeal swab specimens and should not be used as a sole basis for treatment. Nasal washings and aspirates are unacceptable for Xpert Xpress SARS-CoV-2/FLU/RSV testing.  Fact Sheet for Patients: BloggerCourse.com  Fact Sheet for Healthcare Providers: SeriousBroker.it  This test is not yet approved or cleared by the [place] FDA and has been authorized for detection and/or diagnosis of SARS-CoV-2 by FDA under an Emergency Use Authorization (EUA). This EUA will remain in effect (meaning this test can be used) for the duration of the COVID-19 declaration under Section 564(b)(1) of the Act, 21 U.S.C. section 360bbb-3(b)(1), unless the authorization is terminated or revoked.  Performed at [facility], [address] [place], [place] [address]          Radiology Studies: No results found.    Scheduled Meds:  cloNIDine  0.1 mg Oral TID   diphenoxylate-atropine  1 tablet Oral BID   enoxaparin (LOVENOX) injection  40 mg Subcutaneous Daily   folic acid  1 mg Oral Daily   LORazepam  0-4 mg Intravenous Q12H   Or   LORazepam  0-4 mg Oral Q12H   losartan  50 mg Oral Daily   multivitamin with minerals  1 tablet Oral Daily   nicotine  21 mg Transdermal Daily   thiamine  100 mg Oral Daily   Or   thiamine  100 mg Intravenous Daily   Continuous Infusions:   LOS: 2 days      Calvert Cantor, MD [facility] Pager: [email or web address] 04/11/2021, 11:17 AM

## 2021-04-11 NOTE — Plan of Care (Signed)
  Problem: Clinical Measurements: Goal: Will remain free from infection Outcome: Completed/Met Goal: Diagnostic test results will improve Outcome: Completed/Met Goal: Respiratory complications will improve Outcome: Completed/Met Goal: Cardiovascular complication will be avoided Outcome: Completed/Met   

## 2021-04-12 DIAGNOSIS — I10 Essential (primary) hypertension: Secondary | ICD-10-CM | POA: Diagnosis not present

## 2021-04-12 DIAGNOSIS — F10230 Alcohol dependence with withdrawal, uncomplicated: Secondary | ICD-10-CM | POA: Diagnosis not present

## 2021-04-12 LAB — CBC
HCT: 42.3 % (ref 39.0–52.0)
Hemoglobin: 14.2 g/dL (ref 13.0–17.0)
MCH: 30.9 pg (ref 26.0–34.0)
MCHC: 33.6 g/dL (ref 30.0–36.0)
MCV: 92.2 fL (ref 80.0–100.0)
Platelets: 121 10*3/uL — ABNORMAL LOW (ref 150–400)
RBC: 4.59 MIL/uL (ref 4.22–5.81)
RDW: 12.9 % (ref 11.5–15.5)
WBC: 5.5 10*3/uL (ref 4.0–10.5)
nRBC: 0 % (ref 0.0–0.2)

## 2021-04-12 LAB — BASIC METABOLIC PANEL
Anion gap: 8 (ref 5–15)
BUN: 8 mg/dL (ref 6–20)
CO2: 28 mmol/L (ref 22–32)
Calcium: 9.3 mg/dL (ref 8.9–10.3)
Chloride: 101 mmol/L (ref 98–111)
Creatinine, Ser: 0.7 mg/dL (ref 0.61–1.24)
GFR, Estimated: >60 mL/min (ref >60)
Glucose, Bld: 94 mg/dL (ref 70–99)
Potassium: 3.5 mmol/L (ref 3.5–5.1)
Sodium: 137 mmol/L (ref 135–145)

## 2021-04-12 LAB — MAGNESIUM: Magnesium: 1.7 mg/dL (ref 1.7–2.4)

## 2021-04-12 MED ORDER — ATENOLOL 25 MG PO TABS
25.0000 mg | ORAL_TABLET | Freq: Every day | ORAL | Status: DC
  Administered 2021-04-12 – 2021-04-13 (×2): 25 mg via ORAL
  Filled 2021-04-12 (×2): qty 1

## 2021-04-12 MED ORDER — MAGNESIUM SULFATE 2 GM/50ML IV SOLN
2.0000 g | Freq: Once | INTRAVENOUS | Status: AC
  Administered 2021-04-12: 2 g via INTRAVENOUS
  Filled 2021-04-12: qty 50

## 2021-04-12 MED ORDER — POTASSIUM CHLORIDE CRYS ER 20 MEQ PO TBCR
40.0000 meq | EXTENDED_RELEASE_TABLET | Freq: Once | ORAL | Status: AC
  Administered 2021-04-12: 40 meq via ORAL
  Filled 2021-04-12: qty 2

## 2021-04-12 NOTE — Progress Notes (Signed)
PROGRESS NOTE    DHANVIN SZETO   OTL:[id number]  DOB: May 17, 1976  DOA: 04/09/2021 PCP: [facility] [place]   Brief Narrative:  Isacc Turney Masseyis a 45 y.o. male with medical history significant for AUD, HTN who presented for alcohol withdrawal symptoms on 04/07/21. He was admitted and subsequently left AMA on evening of 04/08/21. He was brought back by police department as he had outstanding warrents and was taken to jail but then developed withdrawal symptoms so brought back to ER. He was found to have hypokalemia when admitted and was given some potassium and magnesium. Upon return he continues to have low potassium and magnesium levels.  Reports that he drinks 1/2-3/4 of a liter of vodka per day.  He last drink 3 days ago.  Reports that on the morning of April 07, 2021 he woke up and his girlfriend was dead in the bed next to him.  He had called EMS and the police but they were unable to revive his girlfriend and patient was brought to the hospital for alcohol withdrawal which led to his first admission.  He states he has had alcohol withdrawal with hallucinations and seizures in the past.  He states he is having intermittent auditory and visual hallucinations over the past 24 hours. States he has not had any seizure activity recently.   Subjective: -Diarrhea improving anxiety and tremors improving but not resolved   -Left first met at bedside.     Assessment & Plan:   Principal Problem:   Alcohol withdrawal ([place]) - he drinks 2 bottles of Vodka a day -cont CIWA Continue clonidine and atenolol for tremors & BP -Continue folic acid, thiamine and multivitamins -DTs improving anticipate possible discharge in 1 to 2 days  Active Problems:   Hypokalemia   Hypomagnesemia - replacing  Diarrhea - started Lomotil PRN- he has taken a dose today    Essential hypertension - added Clonidine and atenolol  Time spent in minutes: 35 DVT prophylaxis: enoxaparin (LOVENOX)  injection 40 mg Start: 04/09/21 1000  Code Status: Full code Family Communication:  Level of Care: Level of care: Telemetry Medical Disposition Plan:  Status is: Inpatient  Remains inpatient appropriate because:IV treatments appropriate due to intensity of illness or inability to take PO  Dispo: The patient is from: Home              Anticipated d/c is to: Home              Patient currently is not medically stable to d/c.   Difficult to place patient No   Consultants:  none Procedures:  none Antimicrobials:  Anti-infectives (From admission, onward)    None       Objective: Vitals:   04/12/21 0525 04/12/21 0526 04/12/21 1017 04/12/21 1656  BP:   (!) 176/126 (!) 141/108  Pulse:   80 71  Resp:   19 20  Temp:   98.7 F (37.1 C) 99.5 F (37.5 C)  TempSrc:   Oral Oral  SpO2:   100% 99%  Weight:  66 kg    Height: _0  (1.753 m)       Intake/Output Summary (Last 24 hours) at 04/12/2021 2010 Last data filed at 04/12/2021 1745 Gross per 24 hour  Intake 1345 ml  Output 1200 ml  Net 145 ml   Filed Weights   04/12/21 0526  Weight: 66 kg    Examination: General exam: Appears comfortable  HEENT: PERRLA, oral mucosa moist, no sclera icterus  or thrush Respiratory system: Clear to auscultation. Respiratory effort normal. Cardiovascular system: S1 & S2 heard, regular rate and rhythm Gastrointestinal system: Abdomen soft, non-tender, nondistended. Normal bowel sounds   Central nervous system:   No focal neurological deficits, mild tremors Extremities: No cyanosis, clubbing or edema- tremors noted Skin: No rashes or ulcers Psychiatry: Slightly anxious, cooperative, alert and oriented x3.    Data Reviewed: I have personally reviewed following labs and imaging studies  CBC: Recent Labs  Lab 04/07/21 1735 04/08/21 0418 04/09/21 0154 04/09/21 0600 04/12/21 0303  WBC 6.5 3.4* 6.4 5.4 5.5  NEUTROABS 5.6  --  5.0  --   --   HGB 14.5 14.2 14.2 13.7 14.2  HCT 42.1 40.5  41.5 39.6 42.3  MCV 89.6 89.4 91.2 91.0 92.2  PLT 142* PLATELET CLUMPS NOTED ON SMEAR, UNABLE TO ESTIMATE 96* 87* 502*   Basic Metabolic Panel: Recent Labs  Lab 04/08/21 0418 04/09/21 0154 04/09/21 0600 04/10/21 0332 04/10/21 0813 04/11/21 0302 04/12/21 0303  NA 138 138 138  --  136 136 137  K 2.7* 2.9* 3.0*  --  3.3* 3.7 3.5  CL 103 102 103  --  105 101 101  CO2 _0 --  20* 23 28  GLUCOSE 81 138* 59*  --  87 74 94  BUN <5* 5* <5*  --  5* 5* 8  CREATININE 0.61 0.83 0.74  --  0.61 0.62 0.70  CALCIUM 8.3* 8.6* 8.5*  --  8.2* 8.9 9.3  MG 1.5* 1.6*  --  1.9 1.8 1.7 1.7  PHOS 2.8  --   --   --   --   --   --    GFR: Estimated Creatinine Clearance: 111.1 mL/min (by C-G formula based on SCr of 0.7 mg/dL). Liver Function Tests: Recent Labs  Lab 04/07/21 1735 04/08/21 0418 04/09/21 0154  AST 117* 78* 97*  ALT 58* 44 55*  ALKPHOS 81 71 70  BILITOT 1.3* 1.0 1.2  PROT 7.4 6.5 7.0  ALBUMIN 4.2 3.5 4.1   No results for input(s): LIPASE, AMYLASE in the last 168 hours. No results for input(s): AMMONIA in the last 168 hours. Coagulation Profile: No results for input(s): INR, PROTIME in the last 168 hours. Cardiac Enzymes: No results for input(s): CKTOTAL, CKMB, CKMBINDEX, TROPONINI in the last 168 hours. BNP (last 3 results) No results for input(s): PROBNP in the last 8760 hours. HbA1C: No results for input(s): HGBA1C in the last 72 hours.  CBG: Recent Labs  Lab 04/09/21 1756  GLUCAP 103*   Lipid Profile: No results for input(s): CHOL, HDL, LDLCALC, TRIG, CHOLHDL, LDLDIRECT in the last 72 hours. Thyroid Function Tests: No results for input(s): TSH, T4TOTAL, FREET4, T3FREE, THYROIDAB in the last 72 hours. Anemia Panel: No results for input(s): VITAMINB12, FOLATE, FERRITIN, TIBC, IRON, RETICCTPCT in the last 72 hours. Urine analysis:    Component Value Date/Time   COLORURINE YELLOW 04/07/2021 1735   APPEARANCEUR CLEAR 04/07/2021 1735   LABSPEC 1.015 04/07/2021  1735   PHURINE 9.0 (H) 04/07/2021 1735   GLUCOSEU 150 (A) 04/07/2021 1735   HGBUR NEGATIVE 04/07/2021 1735   BILIRUBINUR NEGATIVE 04/07/2021 1735   KETONESUR NEGATIVE 04/07/2021 1735   PROTEINUR 100 (A) 04/07/2021 1735   NITRITE NEGATIVE 04/07/2021 1735   LEUKOCYTESUR NEGATIVE 04/07/2021 1735   Sepsis Labs: _1 (procalcitonin:4,lacticidven:4) ) Recent Results (from the past 240 hour(s))  Resp Panel by RT-PCR (Flu A&B, Covid) Nasopharyngeal Swab     Status: None  Collection Time: 04/07/21  9:17 PM   Specimen: Nasopharyngeal Swab; Nasopharyngeal(NP) swabs in vial transport medium  Result Value Ref Range Status   SARS Coronavirus 2 by RT PCR NEGATIVE NEGATIVE Final    Comment: (NOTE) SARS-CoV-2 target nucleic acids are NOT DETECTED.  The SARS-CoV-2 RNA is generally detectable in upper respiratory specimens during the acute phase of infection. The lowest concentration of SARS-CoV-2 viral copies this assay can detect is 138 copies/mL. A negative result does not preclude SARS-Cov-2 infection and should not be used as the sole basis for treatment or other patient management decisions. A negative result may occur with  improper specimen collection/handling, submission of specimen other than nasopharyngeal swab, presence of viral mutation(s) within the areas targeted by this assay, and inadequate number of viral copies(<138 copies/mL). A negative result must be combined with clinical observations, patient history, and epidemiological information. The expected result is Negative.  Fact Sheet for Patients:  EntrepreneurPulse.com.au  Fact Sheet for Healthcare Providers:  IncredibleEmployment.be  This test is no t yet approved or cleared by the [place] FDA and  has been authorized for detection and/or diagnosis of SARS-CoV-2 by FDA under an Emergency Use Authorization (EUA). This EUA will remain  in effect (meaning this test can be used)  for the duration of the COVID-19 declaration under Section 564(b)(1) of the Act, 21 U.S.C.section 360bbb-3(b)(1), unless the authorization is terminated  or revoked sooner.       Influenza A by PCR NEGATIVE NEGATIVE Final   Influenza B by PCR NEGATIVE NEGATIVE Final    Comment: (NOTE) The Xpert Xpress SARS-CoV-2/FLU/RSV plus assay is intended as an aid in the diagnosis of influenza from Nasopharyngeal swab specimens and should not be used as a sole basis for treatment. Nasal washings and aspirates are unacceptable for Xpert Xpress SARS-CoV-2/FLU/RSV testing.  Fact Sheet for Patients: EntrepreneurPulse.com.au  Fact Sheet for Healthcare Providers: IncredibleEmployment.be  This test is not yet approved or cleared by the [place] FDA and has been authorized for detection and/or diagnosis of SARS-CoV-2 by FDA under an Emergency Use Authorization (EUA). This EUA will remain in effect (meaning this test can be used) for the duration of the COVID-19 declaration under Section 564(b)(1) of the Act, 21 U.S.C. section 360bbb-3(b)(1), unless the authorization is terminated or revoked.  Performed at [facility], [place] [address], [place], [place] [address]       Radiology Studies: No results found.    Scheduled Meds:  atenolol  25 mg Oral QHS   cloNIDine  0.1 mg Oral TID   diphenoxylate-atropine  1 tablet Oral BID   enoxaparin (LOVENOX) injection  40 mg Subcutaneous Daily   folic acid  1 mg Oral Daily   LORazepam  0-4 mg Intravenous Q12H   Or   LORazepam  0-4 mg Oral Q12H   losartan  50 mg Oral Daily   multivitamin with minerals  1 tablet Oral Daily   nicotine  21 mg Transdermal Daily   thiamine  100 mg Oral Daily   Or   thiamine  100 mg Intravenous Daily   Continuous Infusions:   LOS: 3 days    Roxan Hockey, MD [facility] Pager: [email or web address] 04/12/2021, 8:10 PM

## 2021-04-12 NOTE — Progress Notes (Signed)
   04/12/21 1656  Vitals  Temp 99.5 F (37.5 C)  Temp Source Oral  BP (!) 141/108  MAP (mmHg) 119  BP Location Left Arm  BP Method Automatic  Patient Position (if appropriate) Lying  Pulse Rate 71  Pulse Rate Source Monitor  Resp 20  MEWS COLOR  MEWS Score Color Green  Oxygen Therapy  SpO2 99 %  O2 Device Room Air  MEWS Score  MEWS Temp 0  MEWS Systolic 0  MEWS Pulse 0  MEWS RR 0  MEWS LOC 0  MEWS Score 0  Provider Notification  Provider Name/Title Shon Hale, MD  Date Provider Notified 04/12/21  Time Provider Notified 1703  Notification Type Page  Notification Reason Other (Comment)  Provider response Other (Comment) (continue with current care)  Date of Provider Response 04/12/21  Time of Provider Response 1703    04/12/21 1656  Vitals  Temp 99.5 F (37.5 C)  Temp Source Oral  BP (!) 141/108  MAP (mmHg) 119  BP Location Left Arm  BP Method Automatic  Patient Position (if appropriate) Lying  Pulse Rate 71  Pulse Rate Source Monitor  Resp 20  MEWS COLOR  MEWS Score Color Green  Oxygen Therapy  SpO2 99 %  O2 Device Room Air  MEWS Score  MEWS Temp 0  MEWS Systolic 0  MEWS Pulse 0  MEWS RR 0  MEWS LOC 0  MEWS Score 0  Provider Notification  Provider Name/Title Shon Hale, MD  Date Provider Notified 04/12/21  Time Provider Notified 1703  Notification Type Page  Notification Reason Other (Comment)  Provider response Other (Comment) (continue with current care)  Date of Provider Response 04/12/21  Time of Provider Response 1703    04/12/21 1656  Vitals  Temp 99.5 F (37.5 C)  Temp Source Oral  BP (!) 141/108  MAP (mmHg) 119  BP Location Left Arm  BP Method Automatic  Patient Position (if appropriate) Lying  Pulse Rate 71  Pulse Rate Source Monitor  Resp 20  MEWS COLOR  MEWS Score Color Green  Oxygen Therapy  SpO2 99 %  O2 Device Room Air  MEWS Score  MEWS Temp 0  MEWS Systolic 0  MEWS Pulse 0  MEWS RR 0  MEWS LOC 0   MEWS Score 0  Provider Notification  Provider Name/Title Shon Hale, MD  Date Provider Notified 04/12/21  Time Provider Notified 1703  Notification Type Page  Notification Reason Other (Comment)  Provider response Other (Comment) (continue with current care)  Date of Provider Response 04/12/21  Time of Provider Response [phone]

## 2021-04-13 DIAGNOSIS — F10230 Alcohol dependence with withdrawal, uncomplicated: Secondary | ICD-10-CM | POA: Diagnosis not present

## 2021-04-13 DIAGNOSIS — E876 Hypokalemia: Secondary | ICD-10-CM | POA: Diagnosis not present

## 2021-04-13 DIAGNOSIS — I10 Essential (primary) hypertension: Secondary | ICD-10-CM | POA: Diagnosis not present

## 2021-04-13 MED ORDER — TRAZODONE HCL 100 MG PO TABS
100.0000 mg | ORAL_TABLET | Freq: Every day | ORAL | Status: DC
  Administered 2021-04-13: 100 mg via ORAL
  Filled 2021-04-13: qty 1

## 2021-04-13 MED ORDER — LORAZEPAM 1 MG PO TABS: 0.0000 mg | ORAL_TABLET | Freq: Two times a day (BID) | ORAL | Status: DC

## 2021-04-13 MED ORDER — BUSPIRONE HCL 5 MG PO TABS
5.0000 mg | ORAL_TABLET | Freq: Two times a day (BID) | ORAL | Status: DC
  Administered 2021-04-13 – 2021-04-14 (×3): 5 mg via ORAL
  Filled 2021-04-13 (×6): qty 1

## 2021-04-13 MED ORDER — LORAZEPAM 2 MG/ML IJ SOLN
0.0000 mg | Freq: Two times a day (BID) | INTRAMUSCULAR | Status: DC
Start: 2021-04-13 — End: 2021-04-14

## 2021-04-13 MED ORDER — LORAZEPAM 2 MG/ML IJ SOLN
1.0000 mg | Freq: Once | INTRAMUSCULAR | Status: AC
  Administered 2021-04-13: 1 mg via INTRAVENOUS
  Filled 2021-04-13: qty 1

## 2021-04-13 NOTE — Progress Notes (Signed)
PROGRESS NOTE    Jared George   DJM:[id number]  DOB: 04-05-1976  DOA: 04/09/2021 PCP: [facility]   Brief Narrative:  Arvid Marengo Masseyis a 45 y.o. male with medical history significant for AUD, HTN who presented for alcohol withdrawal symptoms on 04/07/21. He was admitted and subsequently left AMA on evening of 04/08/21. He was brought back by police department as he had outstanding warrents and was taken to jail but then developed withdrawal symptoms so brought back to ER. He was found to have hypokalemia when admitted and was given some potassium and magnesium. Upon return he continues to have low potassium and magnesium levels.  Reports that he drinks 1/2-3/4 of a liter of vodka per day.  He last drink 3 days ago.  Reports that on the morning of April 07, 2021 he woke up and his girlfriend was dead in the bed next to him.  He had called EMS and the police but they were unable to revive his girlfriend and patient was brought to the hospital for alcohol withdrawal which led to his first admission.  He states he has had alcohol withdrawal with hallucinations and seizures in the past.  He states he is having intermittent auditory and visual hallucinations over the past 24 hours. States he has not had any seizure activity recently.   Subjective: Still having mild tremors and feeling anxious. He has had anxiety for a long time.    Assessment & Plan:   Principal Problem:   Alcohol withdrawal (HCC) - he drinks 2 bottles of Vodka a day -cont CIWA - added Clonidine and then Atenolol for tremors & BP    Active Problems: Anxiety and insomnia - start Buspar BID and Trazodone    Hypokalemia   Hypomagnesemia - replacing  Diarrhea - started Lomotil PRN-      Essential hypertension - added Clonidine and Atenolol  Time spent in minutes: 35 DVT prophylaxis: enoxaparin (LOVENOX) injection 40 mg Start: 04/09/21 1000  Code Status: Full code Family Communication:  Level  of Care: Level of care: Telemetry Medical Disposition Plan:  Status is: Inpatient  Remains inpatient appropriate because:IV treatments appropriate due to intensity of illness or inability to take PO  Dispo: The patient is from: Home              Anticipated d/c is to: Home              Patient currently is not medically stable to d/c.   Difficult to place patient No   Consultants:  none Procedures:  none Antimicrobials:  Anti-infectives (From admission, onward)    None        Objective: Vitals:   04/13/21 0022 04/13/21 0130 04/13/21 0430 04/13/21 1219  BP: 139/84 (!) 144/91 (!) 164/111 (!) 145/94  Pulse: 71 81 81 75  Resp: 20 19 18 16   Temp: 98.8 F (37.1 C) 98.4 F (36.9 C) 97.9 F (36.6 C) 98.3 F (36.8 C)  TempSrc: Oral Oral Oral Oral  SpO2: 100% 100% 100% 100%  Weight:      Height:        Intake/Output Summary (Last 24 hours) at 04/13/2021 1441 Last data filed at 04/13/2021 0131 Gross per 24 hour  Intake 50 ml  Output 600 ml  Net -550 ml    Filed Weights   04/12/21 0526  Weight: 66 kg    Examination: General exam: Appears comfortable  HEENT: PERRLA, oral mucosa moist, no sclera icterus or thrush Respiratory  system: Clear to auscultation. Respiratory effort normal. Cardiovascular system: S1 & S2 heard, regular rate and rhythm Gastrointestinal system: Abdomen soft, non-tender, nondistended. Normal bowel sounds   Central nervous system: Alert and oriented. No focal neurological deficits. Extremities: No cyanosis, clubbing or edema Skin: No rashes or ulcers Psychiatry:  Mood & affect appropriate.      Data Reviewed: I have personally reviewed following labs and imaging studies  CBC: Recent Labs  Lab 04/07/21 1735 04/08/21 0418 04/09/21 0154 04/09/21 0600 04/12/21 0303  WBC 6.5 3.4* 6.4 5.4 5.5  NEUTROABS 5.6  --  5.0  --   --   HGB 14.5 14.2 14.2 13.7 14.2  HCT 42.1 40.5 41.5 39.6 42.3  MCV 89.6 89.4 91.2 91.0 92.2  PLT 142* PLATELET  CLUMPS NOTED ON SMEAR, UNABLE TO ESTIMATE 96* 87* 121*    Basic Metabolic Panel: Recent Labs  Lab 04/08/21 0418 04/09/21 0154 04/09/21 0600 04/10/21 0332 04/10/21 0813 04/11/21 0302 04/12/21 0303  NA 138 138 138  --  136 136 137  K 2.7* 2.9* 3.0*  --  3.3* 3.7 3.5  CL 103 102 103  --  105 101 101  CO2 25 23 25   --  20* 23 28  GLUCOSE 81 138* 59*  --  87 74 94  BUN <5* 5* <5*  --  5* 5* 8  CREATININE 0.61 0.83 0.74  --  0.61 0.62 0.70  CALCIUM 8.3* 8.6* 8.5*  --  8.2* 8.9 9.3  MG 1.5* 1.6*  --  1.9 1.8 1.7 1.7  PHOS 2.8  --   --   --   --   --   --     GFR: Estimated Creatinine Clearance: 111.1 mL/min (by C-G formula based on SCr of 0.7 mg/dL). Liver Function Tests: Recent Labs  Lab 04/07/21 1735 04/08/21 0418 04/09/21 0154  AST 117* 78* 97*  ALT 58* 44 55*  ALKPHOS 81 71 70  BILITOT 1.3* 1.0 1.2  PROT 7.4 6.5 7.0  ALBUMIN 4.2 3.5 4.1    No results for input(s): LIPASE, AMYLASE in the last 168 hours. No results for input(s): AMMONIA in the last 168 hours. Coagulation Profile: No results for input(s): INR, PROTIME in the last 168 hours. Cardiac Enzymes: No results for input(s): CKTOTAL, CKMB, CKMBINDEX, TROPONINI in the last 168 hours. BNP (last 3 results) No results for input(s): PROBNP in the last 8760 hours. HbA1C: No results for input(s): HGBA1C in the last 72 hours.  CBG: Recent Labs  Lab 04/09/21 1756  GLUCAP 103*    Lipid Profile: No results for input(s): CHOL, HDL, LDLCALC, TRIG, CHOLHDL, LDLDIRECT in the last 72 hours. Thyroid Function Tests: No results for input(s): TSH, T4TOTAL, FREET4, T3FREE, THYROIDAB in the last 72 hours. Anemia Panel: No results for input(s): VITAMINB12, FOLATE, FERRITIN, TIBC, IRON, RETICCTPCT in the last 72 hours. Urine analysis:    Component Value Date/Time   COLORURINE YELLOW 04/07/2021 1735   APPEARANCEUR CLEAR 04/07/2021 1735   LABSPEC 1.015 04/07/2021 1735   PHURINE 9.0 (H) 04/07/2021 1735   GLUCOSEU 150 (A)  04/07/2021 1735   HGBUR NEGATIVE 04/07/2021 1735   BILIRUBINUR NEGATIVE 04/07/2021 1735   KETONESUR NEGATIVE 04/07/2021 1735   PROTEINUR 100 (A) 04/07/2021 1735   NITRITE NEGATIVE 04/07/2021 1735   LEUKOCYTESUR NEGATIVE 04/07/2021 1735   Sepsis Labs: @LABRCNTIP (procalcitonin:4,lacticidven:4) ) Recent Results (from the past 240 hour(s))  Resp Panel by RT-PCR (Flu A&B, Covid) Nasopharyngeal Swab     Status: None   Collection  Time: 04/07/21  9:17 PM   Specimen: Nasopharyngeal Swab; Nasopharyngeal(NP) swabs in vial transport medium  Result Value Ref Range Status   SARS Coronavirus 2 by RT PCR NEGATIVE NEGATIVE Final    Comment: (NOTE) SARS-CoV-2 target nucleic acids are NOT DETECTED.  The SARS-CoV-2 RNA is generally detectable in upper respiratory specimens during the acute phase of infection. The lowest concentration of SARS-CoV-2 viral copies this assay can detect is 138 copies/mL. A negative result does not preclude SARS-Cov-2 infection and should not be used as the sole basis for treatment or other patient management decisions. A negative result may occur with  improper specimen collection/handling, submission of specimen other than nasopharyngeal swab, presence of viral mutation(s) within the areas targeted by this assay, and inadequate number of viral copies(<138 copies/mL). A negative result must be combined with clinical observations, patient history, and epidemiological information. The expected result is Negative.  Fact Sheet for Patients:  BloggerCourse.com  Fact Sheet for Healthcare Providers:  SeriousBroker.it  This test is no t yet approved or cleared by the [place] FDA and  has been authorized for detection and/or diagnosis of SARS-CoV-2 by FDA under an Emergency Use Authorization (EUA). This EUA will remain  in effect (meaning this test can be used) for the duration of the COVID-19 declaration under Section  564(b)(1) of the Act, 21 U.S.C.section 360bbb-3(b)(1), unless the authorization is terminated  or revoked sooner.       Influenza A by PCR NEGATIVE NEGATIVE Final   Influenza B by PCR NEGATIVE NEGATIVE Final    Comment: (NOTE) The Xpert Xpress SARS-CoV-2/FLU/RSV plus assay is intended as an aid in the diagnosis of influenza from Nasopharyngeal swab specimens and should not be used as a sole basis for treatment. Nasal washings and aspirates are unacceptable for Xpert Xpress SARS-CoV-2/FLU/RSV testing.  Fact Sheet for Patients: BloggerCourse.com  Fact Sheet for Healthcare Providers: SeriousBroker.it  This test is not yet approved or cleared by the [place] FDA and has been authorized for detection and/or diagnosis of SARS-CoV-2 by FDA under an Emergency Use Authorization (EUA). This EUA will remain in effect (meaning this test can be used) for the duration of the COVID-19 declaration under Section 564(b)(1) of the Act, 21 U.S.C. section 360bbb-3(b)(1), unless the authorization is terminated or revoked.  Performed at [facility], [address], [place], [place] [address]          Radiology Studies: No results found.    Scheduled Meds:  atenolol  25 mg Oral QHS   busPIRone  5 mg Oral BID   cloNIDine  0.1 mg Oral TID   diphenoxylate-atropine  1 tablet Oral BID   enoxaparin (LOVENOX) injection  40 mg Subcutaneous Daily   folic acid  1 mg Oral Daily   LORazepam  0-4 mg Intravenous Q12H   Or   LORazepam  0-4 mg Oral Q12H   losartan  50 mg Oral Daily   multivitamin with minerals  1 tablet Oral Daily   nicotine  21 mg Transdermal Daily   thiamine  100 mg Oral Daily   Or   thiamine  100 mg Intravenous Daily   Continuous Infusions:   LOS: 4 days      Calvert Cantor, MD [facility] Pager: [email or web address] 04/13/2021, 2:41 PM

## 2021-04-13 NOTE — Progress Notes (Signed)
Patient trans in from 5M05 to 5C20. On arrival, AOX45. MAE and stable. Has Emergency planning/management officer accompanied him because he had originally come from the jail.

## 2021-04-13 NOTE — Progress Notes (Addendum)
Male sheriff officer remains at pt bedside. Pt remains in law enforcement metal cuffs to bilateral ankle and right wrist. Pt sleeping soundly rr unlabored skin w/d

## 2021-04-13 NOTE — Progress Notes (Addendum)
Male sheriff officer at pt bedside. Pt with law enforcement metal cuffs intact to bilateral ankle and right wrist, CNS intact, no skin injury noted. Pt fully alert GCS 15 calm cooperative

## 2021-04-13 NOTE — Progress Notes (Signed)
Male sheriff officer remains at bedside. Pt in law enforcement metal cuffs to bilateral ankle and right wrist. Pt sleeping soundly rr unlabored skin w/d

## 2021-04-14 DIAGNOSIS — F419 Anxiety disorder, unspecified: Secondary | ICD-10-CM | POA: Diagnosis not present

## 2021-04-14 DIAGNOSIS — F10239 Alcohol dependence with withdrawal, unspecified: Secondary | ICD-10-CM | POA: Diagnosis not present

## 2021-04-14 DIAGNOSIS — I10 Essential (primary) hypertension: Secondary | ICD-10-CM | POA: Diagnosis not present

## 2021-04-14 DIAGNOSIS — Z720 Tobacco use: Secondary | ICD-10-CM

## 2021-04-14 DIAGNOSIS — F10230 Alcohol dependence with withdrawal, uncomplicated: Secondary | ICD-10-CM | POA: Diagnosis not present

## 2021-04-14 MED ORDER — BUSPIRONE HCL 5 MG PO TABS: 5.0000 mg | ORAL_TABLET | Freq: Two times a day (BID) | ORAL | Status: AC

## 2021-04-14 MED ORDER — TRAZODONE HCL 100 MG PO TABS: 100.0000 mg | ORAL_TABLET | Freq: Every day | ORAL | 0 refills | Status: AC

## 2021-04-14 MED ORDER — THIAMINE HCL 100 MG PO TABS: 100.0000 mg | ORAL_TABLET | Freq: Every day | ORAL | 0 refills | Status: AC

## 2021-04-14 MED ORDER — LOSARTAN POTASSIUM 50 MG PO TABS: 50.0000 mg | ORAL_TABLET | Freq: Every day | ORAL | 0 refills | Status: AC

## 2021-04-14 MED ORDER — ACETAMINOPHEN 325 MG PO TABS: 650.0000 mg | ORAL_TABLET | Freq: Four times a day (QID) | ORAL | Status: AC | PRN

## 2021-04-14 MED ORDER — NICOTINE 21 MG/24HR TD PT24: 21.0000 mg | MEDICATED_PATCH | Freq: Every day | TRANSDERMAL | 0 refills | Status: AC

## 2021-04-14 MED ORDER — ATENOLOL 25 MG PO TABS: 25.0000 mg | ORAL_TABLET | Freq: Every day | ORAL | 0 refills | Status: AC

## 2021-04-14 NOTE — Progress Notes (Signed)
Male Emergency planning/management officer at bedside. Pt fully alert GCS 15. Law enforcement metal cuffs to pt left anke and right wrist, CNS intact, no skin injury noted

## 2021-04-14 NOTE — Discharge Summary (Signed)
Physician Discharge Summary  Jared George VQQ:[id number] DOB: [birth year] DOA: 04/09/2021  PCP: [facility]  Admit date: 04/09/2021 Discharge date: 04/14/2021  Admitted From: jail  Disposition:  jail   Recommendations for Outpatient Follow-up:  Continue to encourage alcohol and smoking cessation  Recommend counseling for death of loved one  Home Health:  none  Discharge Condition:  stable   CODE STATUS:  full code   Diet recommendation:  heart healthy Consultations: none  Procedures/Studies: none   Discharge Diagnoses:  Principal Problem:   Alcohol withdrawal (HCC) Active Problems:   Anxiety   Alcohol abuse   Hypokalemia   Hypomagnesemia   Essential hypertension   Nicotine abuse     Brief Summary: Jared Yurko Masseyis a 45 y.o. male with medical history significant for AUD, HTN who presented for alcohol withdrawal symptoms on 04/07/21. He was admitted and subsequently left AMA on evening of 04/08/21. He was brought back by police department as he had outstanding warrents and was taken to jail but then developed withdrawal symptoms so brought back to ER. He was found to have hypokalemia when admitted and was given some potassium and magnesium. Upon return he continues to have low potassium and magnesium levels.  Reports that he drinks 1/2-3/4 of a liter of vodka per day.  He last drink 3 days ago.  Reports that on the morning of April 07, 2021 he woke up and his girlfriend was dead in the bed next to him.  He had called EMS and the police but they were unable to revive his girlfriend and patient was brought to the hospital for alcohol withdrawal which led to his first admission.  He states he has had alcohol withdrawal with hallucinations and seizures in the past.  He states he is having intermittent auditory and visual hallucinations over the past 24 hours. States he has not had any seizure activity recently.  Hospital Course:  Principal Problem:   Alcohol  withdrawal (HCC) with hallucinations - he drinks 2 bottles of Vodka a day - Withdrawal has been treated with IV Ativan and he is stable now - he will be returning to the jail but I have recommended he join AA afterwards     Active Problems: Anxiety and insomnia chronic and acute (situational) - started Buspar BID and Trazodone on 8/6 which he states are working well for him w/o side effects thus far - he understands that the Buspar may need to be titrated up to fullly control his symptoms     Hypokalemia   Hypomagnesemia - replaced   Diarrhea - started Lomotil PRN- has resolved      Essential hypertension - will continue Atenolol and Losartan  Nicotine abuse - continue a Nicotine patch and wean as outpt   Discharge Exam: Vitals:   04/13/21 2325 04/14/21 0505  BP: 136/83 125/84  Pulse: 70 70  Resp: 18 16  Temp: 98.8 F (37.1 C) 98.1 F (36.7 C)  SpO2: 99% 100%   Vitals:   04/13/21 1219 04/13/21 1757 04/13/21 2325 04/14/21 0505  BP: (!) 145/94 (!) 137/94 136/83 125/84  Pulse: 75 69 70 70  Resp: 16 16 18 16   Temp: 98.3 F (36.8 C) 98.5 F (36.9 C) 98.8 F (37.1 C) 98.1 F (36.7 C)  TempSrc: Oral Oral Oral Oral  SpO2: 100% 100% 99% 100%  Weight:      Height:        General: Pt is alert, awake, not in acute distress Cardiovascular:  RRR, S1/S2 +, no rubs, no gallops Respiratory: CTA bilaterally, no wheezing, no rhonchi Abdominal: Soft, NT, ND, bowel sounds + Extremities: no edema, no cyanosis   Discharge Instructions  Discharge Instructions     Diet - low sodium heart healthy   Complete by: As directed    Increase activity slowly   Complete by: As directed       Allergies as of 04/14/2021   No Known Allergies      Medication List     STOP taking these medications    aspirin 325 MG tablet   gabapentin 300 MG capsule Commonly known as: NEURONTIN   ondansetron 4 MG disintegrating tablet Commonly known as: ZOFRAN-ODT   polyvinyl alcohol 1.4  % ophthalmic solution Commonly known as: LIQUIFILM TEARS       TAKE these medications    acetaminophen 325 MG tablet Commonly known as: TYLENOL Take 2 tablets (650 mg total) by mouth every 6 (six) hours as needed for mild pain (or Fever >/= 101).   atenolol 25 MG tablet Commonly known as: TENORMIN Take 1 tablet (25 mg total) by mouth at bedtime.   busPIRone 5 MG tablet Commonly known as: BUSPAR Take 1 tablet (5 mg total) by mouth 2 (two) times daily.   losartan 50 MG tablet Commonly known as: COZAAR Take 1 tablet (50 mg total) by mouth daily.   nicotine 21 mg/24hr patch Commonly known as: NICODERM CQ - dosed in mg/24 hours Place 1 patch (21 mg total) onto the skin daily.   thiamine 100 MG tablet Take 1 tablet (100 mg total) by mouth daily.   traZODone 100 MG tablet Commonly known as: DESYREL Take 1 tablet (100 mg total) by mouth at bedtime.        No Known Allergies    No results found.   The results of significant diagnostics from this hospitalization (including imaging, microbiology, ancillary and laboratory) are listed below for reference.     Microbiology: Recent Results (from the past 240 hour(s))  Resp Panel by RT-PCR (Flu A&B, Covid) Nasopharyngeal Swab     Status: None   Collection Time: 04/07/21  9:17 PM   Specimen: Nasopharyngeal Swab; Nasopharyngeal(NP) swabs in vial transport medium  Result Value Ref Range Status   SARS Coronavirus 2 by RT PCR NEGATIVE NEGATIVE Final    Comment: (NOTE) SARS-CoV-2 target nucleic acids are NOT DETECTED.  The SARS-CoV-2 RNA is generally detectable in upper respiratory specimens during the acute phase of infection. The lowest concentration of SARS-CoV-2 viral copies this assay can detect is 138 copies/mL. A negative result does not preclude SARS-Cov-2 infection and should not be used as the sole basis for treatment or other patient management decisions. A negative result may occur with  improper specimen  collection/handling, submission of specimen other than nasopharyngeal swab, presence of viral mutation(s) within the areas targeted by this assay, and inadequate number of viral copies(<138 copies/mL). A negative result must be combined with clinical observations, patient history, and epidemiological information. The expected result is Negative.  Fact Sheet for Patients:  BloggerCourse.com  Fact Sheet for Healthcare Providers:  SeriousBroker.it  This test is no t yet approved or cleared by the [place] FDA and  has been authorized for detection and/or diagnosis of SARS-CoV-2 by FDA under an Emergency Use Authorization (EUA). This EUA will remain  in effect (meaning this test can be used) for the duration of the COVID-19 declaration under Section 564(b)(1) of the Act, 21 U.S.C.section 360bbb-3(b)(1), unless the authorization  is terminated  or revoked sooner.       Influenza A by PCR NEGATIVE NEGATIVE Final   Influenza B by PCR NEGATIVE NEGATIVE Final    Comment: (NOTE) The Xpert Xpress SARS-CoV-2/FLU/RSV plus assay is intended as an aid in the diagnosis of influenza from Nasopharyngeal swab specimens and should not be used as a sole basis for treatment. Nasal washings and aspirates are unacceptable for Xpert Xpress SARS-CoV-2/FLU/RSV testing.  Fact Sheet for Patients: BloggerCourse.com  Fact Sheet for Healthcare Providers: SeriousBroker.it  This test is not yet approved or cleared by the [place] FDA and has been authorized for detection and/or diagnosis of SARS-CoV-2 by FDA under an Emergency Use Authorization (EUA). This EUA will remain in effect (meaning this test can be used) for the duration of the COVID-19 declaration under Section 564(b)(1) of the Act, 21 U.S.C. section 360bbb-3(b)(1), unless the authorization is terminated or revoked.  Performed at [facility], [address], [place], [place] [address]      Labs: BNP (last 3 results) No results for input(s): BNP in the last 8760 hours. Basic Metabolic Panel: Recent Labs  Lab 04/08/21 0418 04/09/21 0154 04/09/21 0600 04/10/21 0332 04/10/21 0813 04/11/21 0302 04/12/21 0303  NA 138 138 138  --  136 136 137  K 2.7* 2.9* 3.0*  --  3.3* 3.7 3.5  CL 103 102 103  --  105 101 101  CO2 25 23 25   --  20* 23 28  GLUCOSE 81 138* 59*  --  87 74 94  BUN <5* 5* <5*  --  5* 5* 8  CREATININE 0.61 0.83 0.74  --  0.61 0.62 0.70  CALCIUM 8.3* 8.6* 8.5*  --  8.2* 8.9 9.3  MG 1.5* 1.6*  --  1.9 1.8 1.7 1.7  PHOS 2.8  --   --   --   --   --   --    Liver Function Tests: Recent Labs  Lab 04/07/21 1735 04/08/21 0418 04/09/21 0154  AST 117* 78* 97*  ALT 58* 44 55*  ALKPHOS 81 71 70  BILITOT 1.3* 1.0 1.2  PROT 7.4 6.5 7.0  ALBUMIN 4.2 3.5 4.1   No results for input(s): LIPASE, AMYLASE in the last 168 hours. No results for input(s): AMMONIA in the last 168 hours. CBC: Recent Labs  Lab 04/07/21 1735 04/08/21 0418 04/09/21 0154 04/09/21 0600 04/12/21 0303  WBC 6.5 3.4* 6.4 5.4 5.5  NEUTROABS 5.6  --  5.0  --   --   HGB 14.5 14.2 14.2 13.7 14.2  HCT 42.1 40.5 41.5 39.6 42.3  MCV 89.6 89.4 91.2 91.0 92.2  PLT 142* PLATELET CLUMPS NOTED ON SMEAR, UNABLE TO ESTIMATE 96* 87* 121*   Cardiac Enzymes: No results for input(s): CKTOTAL, CKMB, CKMBINDEX, TROPONINI in the last 168 hours. BNP: Invalid input(s): POCBNP CBG: Recent Labs  Lab 04/09/21 1756  GLUCAP 103*   D-Dimer No results for input(s): DDIMER in the last 72 hours. Hgb A1c No results for input(s): HGBA1C in the last 72 hours. Lipid Profile No results for input(s): CHOL, HDL, LDLCALC, TRIG, CHOLHDL, LDLDIRECT in the last 72 hours. Thyroid function studies No results for input(s): TSH, T4TOTAL, T3FREE, THYROIDAB in the last 72 hours.  Invalid input(s): FREET3 Anemia work up No results for input(s): VITAMINB12,  FOLATE, FERRITIN, TIBC, IRON, RETICCTPCT in the last 72 hours. Urinalysis    Component Value Date/Time   COLORURINE YELLOW 04/07/2021 1735   APPEARANCEUR CLEAR 04/07/2021  1735   LABSPEC 1.015 04/07/2021 1735   PHURINE 9.0 (H) 04/07/2021 1735   GLUCOSEU 150 (A) 04/07/2021 1735   HGBUR NEGATIVE 04/07/2021 1735   BILIRUBINUR NEGATIVE 04/07/2021 1735   KETONESUR NEGATIVE 04/07/2021 1735   PROTEINUR 100 (A) 04/07/2021 1735   NITRITE NEGATIVE 04/07/2021 1735   LEUKOCYTESUR NEGATIVE 04/07/2021 1735   Sepsis Labs Invalid input(s): PROCALCITONIN,  WBC,  LACTICIDVEN Microbiology Recent Results (from the past 240 hour(s))  Resp Panel by RT-PCR (Flu A&B, Covid) Nasopharyngeal Swab     Status: None   Collection Time: 04/07/21  9:17 PM   Specimen: Nasopharyngeal Swab; Nasopharyngeal(NP) swabs in vial transport medium  Result Value Ref Range Status   SARS Coronavirus 2 by RT PCR NEGATIVE NEGATIVE Final    Comment: (NOTE) SARS-CoV-2 target nucleic acids are NOT DETECTED.  The SARS-CoV-2 RNA is generally detectable in upper respiratory specimens during the acute phase of infection. The lowest concentration of SARS-CoV-2 viral copies this assay can detect is 138 copies/mL. A negative result does not preclude SARS-Cov-2 infection and should not be used as the sole basis for treatment or other patient management decisions. A negative result may occur with  improper specimen collection/handling, submission of specimen other than nasopharyngeal swab, presence of viral mutation(s) within the areas targeted by this assay, and inadequate number of viral copies(<138 copies/mL). A negative result must be combined with clinical observations, patient history, and epidemiological information. The expected result is Negative.  Fact Sheet for Patients:  BloggerCourse.comhttps://www.fda.gov/media/152166/download  Fact Sheet for Healthcare Providers:  SeriousBroker.ithttps://www.fda.gov/media/152162/download  This test is no t yet  approved or cleared by the [place] FDA and  has been authorized for detection and/or diagnosis of SARS-CoV-2 by FDA under an Emergency Use Authorization (EUA). This EUA will remain  in effect (meaning this test can be used) for the duration of the COVID-19 declaration under Section 564(b)(1) of the Act, 21 U.S.C.section 360bbb-3(b)(1), unless the authorization is terminated  or revoked sooner.       Influenza A by PCR NEGATIVE NEGATIVE Final   Influenza B by PCR NEGATIVE NEGATIVE Final    Comment: (NOTE) The Xpert Xpress SARS-CoV-2/FLU/RSV plus assay is intended as an aid in the diagnosis of influenza from Nasopharyngeal swab specimens and should not be used as a sole basis for treatment. Nasal washings and aspirates are unacceptable for Xpert Xpress SARS-CoV-2/FLU/RSV testing.  Fact Sheet for Patients: BloggerCourse.comhttps://www.fda.gov/media/152166/download  Fact Sheet for Healthcare Providers: SeriousBroker.ithttps://www.fda.gov/media/152162/download  This test is not yet approved or cleared by the [place] FDA and has been authorized for detection and/or diagnosis of SARS-CoV-2 by FDA under an Emergency Use Authorization (EUA). This EUA will remain in effect (meaning this test can be used) for the duration of the COVID-19 declaration under Section 564(b)(1) of the Act, 21 U.S.C. section 360bbb-3(b)(1), unless the authorization is terminated or revoked.  Performed at [facility], [address] [place], [place] [id number]      Time coordinating discharge in minutes: 65  SIGNED:   Calvert CantorSaima Chayce Robbins, MD  [facility] 04/14/2021, 1:27 PM

## 2021-04-14 NOTE — TOC Transition Note (Addendum)
Transition of Care [facility]) - CM/SW Discharge Note   Patient Details  Name: Jared George MRN: [id number] Date of Birth: 1975/10/03  [facility] of [facility]) CM/SW Contact:  Bess Kinds, RN Phone Number: [phone] 04/14/2021, 1:59 PM   Clinical Narrative:     Notified by MD of patient's readiness to transition back to [facility] [place] [facility]. [facility] at [phone], voicemail left stating patient is discharging and DC to be faxed - CM contact information provided. DC summary faxed to [phone].   Update: Spoke with patient at bedside prior to discharge. Patient stated that he receives primary care at [facility] in [place]. Discussed substance abuse resources and supportive mentoring through AA. Discussed transition back to [facility] jail with patient and officer, and advised that voicemail was left with Medical and that DC summary was faxed to Medical.   Final next level of care: Corrections Facility Barriers to Discharge: No Barriers Identified   Patient Goals and CMS Choice        Discharge Placement                       Discharge Plan and Services                                     Social Determinants of Health (SDOH) Interventions     Readmission Risk Interventions No flowsheet data found.

## 2021-04-14 NOTE — Progress Notes (Signed)
Discharge instructions given and reviewed with pt, pt acknowledge understanding. Pt discharged in law enforcement metal handcuff to bilateral wrist. Pt skin w/d calm and cooperative GCS 15 appears in no apparent distress and no apparent tremors. Pt discharged in custody of 2 male Arboriculturist.

## 2021-04-17 NOTE — Discharge Summary (Signed)
Physician Discharge Summary  Jared George FYB:[id number] DOB: 1976/08/01 DOA: 04/07/2021  [facility]P: [facility]  Admit date: 04/07/2021 Discharge date: 04/17/2021  Admitted From: Home Disposition:  None  Recommendations for Outpatient Follow-up:          LEFT AMA   Brief/Interim Summary:  45 y.o. male past medical history of alcohol abuse, essential hypertension was been drinking about half a liter of vodka per day last drink was yesterday at 9 PM woke up feeling unwell and nauseated vomiting, and when he woke up he found his girlfriend did called EMS found him in alcohol withdrawal and hallucinating the ED potassium was 2.4 magnesium 1.2 COVID-positive in June UDS was positive for cannabis  Discharge Diagnoses:  Active Problems:   Alcohol withdrawal delirium, acute, hyperactive (HCC)  Alcohol withdrawal with delirium hyperactive: With a history of seizures admitted for detox started on CIWA protocol given Rocephin and antibiotic thiamine and folate and IV fluids. Symptoms were controlled overnight wake alert and oriented x4 wanted to leave AGAINST MEDICAL ADVICE told the overnight physician that his family is here and he wants to leave right away.  Risk and benefits were explained to him and he still decided to leave AGAINST MEDICAL ADVICE.  Electrolyte imbalances: Repleted.  Essential hypertension: No change made to his medication.    Elevated blood glucose: He required sliding scale insulin.  Discharge Instructions   Allergies as of 04/08/2021   No Known Allergies      Medication List    You have not been prescribed any medications.     No Known Allergies    Subjective:   Discharge Exam: Vitals:   04/08/21 1711 04/08/21 2130  BP: (!) 178/109 (!) 156/123  Pulse: 72 73  Resp: 20 20  Temp: 97.8 F (36.6 C) 97.8 F (36.6 C)  SpO2: 100% 98%   Vitals:   04/08/21 1600 04/08/21 1711 04/08/21 1711 04/08/21 2130  BP: (!) 141/109  (!)  178/109 (!) 156/123  Pulse: 76  72 73  Resp: 15  20 20   Temp:  97.8 F (36.6 C) 97.8 F (36.6 C) 97.8 F (36.6 C)  TempSrc:  Oral Oral Oral  SpO2: 93%  100% 98%  Weight:  66.4 kg    Height:  5\' 9"  (1.753 m)     Left AGAINST MEDICAL ADVICE   The results of significant diagnostics from this hospitalization (including imaging, microbiology, ancillary and laboratory) are listed below for reference.     Microbiology: Recent Results (from the past 240 hour(s))  Resp Panel by RT-PCR (Flu A&B, Covid) Nasopharyngeal Swab     Status: None   Collection Time: 04/07/21  9:17 PM   Specimen: Nasopharyngeal Swab; Nasopharyngeal(NP) swabs in vial transport medium  Result Value Ref Range Status   SARS Coronavirus 2 by RT PCR NEGATIVE NEGATIVE Final    Comment: (NOTE) SARS-CoV-2 target nucleic acids are NOT DETECTED.  The SARS-CoV-2 RNA is generally detectable in upper respiratory specimens during the acute phase of infection. The lowest concentration of SARS-CoV-2 viral copies this assay can detect is 138 copies/mL. A negative result does not preclude SARS-Cov-2 infection and should not be used as the sole basis for treatment or other patient management decisions. A negative result may occur with  improper specimen collection/handling, submission of specimen other than nasopharyngeal swab, presence of viral mutation(s) within the areas targeted by this assay, and inadequate number of viral copies(<138 copies/mL). A negative result must be combined with clinical observations, patient  history, and epidemiological information. The expected result is Negative.  Fact Sheet for Patients:  BloggerCourse.com  Fact Sheet for Healthcare Providers:  SeriousBroker.it  This test is no t yet approved or cleared by the [place] FDA and  has been authorized for detection and/or diagnosis of SARS-CoV-2 by FDA under an Emergency Use Authorization  (EUA). This EUA will remain  in effect (meaning this test can be used) for the duration of the COVID-19 declaration under Section 564(b)(1) of the Act, 21 U.S.C.section 360bbb-3(b)(1), unless the authorization is terminated  or revoked sooner.       Influenza A by PCR NEGATIVE NEGATIVE Final   Influenza B by PCR NEGATIVE NEGATIVE Final    Comment: (NOTE) The Xpert Xpress SARS-CoV-2/FLU/RSV plus assay is intended as an aid in the diagnosis of influenza from Nasopharyngeal swab specimens and should not be used as a sole basis for treatment. Nasal washings and aspirates are unacceptable for Xpert Xpress SARS-CoV-2/FLU/RSV testing.  Fact Sheet for Patients: BloggerCourse.com  Fact Sheet for Healthcare Providers: SeriousBroker.it  This test is not yet approved or cleared by the [place] FDA and has been authorized for detection and/or diagnosis of SARS-CoV-2 by FDA under an Emergency Use Authorization (EUA). This EUA will remain in effect (meaning this test can be used) for the duration of the COVID-19 declaration under Section 564(b)(1) of the Act, 21 U.S.C. section 360bbb-3(b)(1), unless the authorization is terminated or revoked.  Performed at [facility], [address] [place], [place] [address]      Labs: BNP (last 3 results) No results for input(s): BNP in the last 8760 hours. Basic Metabolic Panel: Recent Labs  Lab 04/11/21 0302 04/12/21 0303  NA 136 137  K 3.7 3.5  CL 101 101  CO2 23 28  GLUCOSE 74 94  BUN 5* 8  CREATININE 0.62 0.70  CALCIUM 8.9 9.3  MG 1.7 1.7   Liver Function Tests: No results for input(s): AST, ALT, ALKPHOS, BILITOT, PROT, ALBUMIN in the last 168 hours. No results for input(s): LIPASE, AMYLASE in the last 168 hours. No results for input(s): AMMONIA in the last 168 hours. CBC: Recent Labs  Lab 04/12/21 0303  WBC 5.5  HGB 14.2  HCT 42.3  MCV 92.2  PLT 121*   Cardiac  Enzymes: No results for input(s): CKTOTAL, CKMB, CKMBINDEX, TROPONINI in the last 168 hours. BNP: Invalid input(s): POCBNP CBG: No results for input(s): GLUCAP in the last 168 hours. D-Dimer No results for input(s): DDIMER in the last 72 hours. Hgb A1c No results for input(s): HGBA1C in the last 72 hours. Lipid Profile No results for input(s): CHOL, HDL, LDLCALC, TRIG, CHOLHDL, LDLDIRECT in the last 72 hours. Thyroid function studies No results for input(s): TSH, T4TOTAL, T3FREE, THYROIDAB in the last 72 hours.  Invalid input(s): FREET3 Anemia work up No results for input(s): VITAMINB12, FOLATE, FERRITIN, TIBC, IRON, RETICCTPCT in the last 72 hours. Urinalysis    Component Value Date/Time   COLORURINE YELLOW 04/07/2021 1735   APPEARANCEUR CLEAR 04/07/2021 1735   LABSPEC 1.015 04/07/2021 1735   PHURINE 9.0 (H) 04/07/2021 1735   GLUCOSEU 150 (A) 04/07/2021 1735   HGBUR NEGATIVE 04/07/2021 1735   BILIRUBINUR NEGATIVE 04/07/2021 1735   KETONESUR NEGATIVE 04/07/2021 1735   PROTEINUR 100 (A) 04/07/2021 1735   NITRITE NEGATIVE 04/07/2021 1735   LEUKOCYTESUR NEGATIVE 04/07/2021 1735   Sepsis Labs Invalid input(s): PROCALCITONIN,  WBC,  LACTICIDVEN Microbiology Recent Results (from the past 240 hour(s))  Resp Panel  by RT-PCR (Flu A&B, Covid) Nasopharyngeal Swab     Status: None   Collection Time: 04/07/21  9:17 PM   Specimen: Nasopharyngeal Swab; Nasopharyngeal(NP) swabs in vial transport medium  Result Value Ref Range Status   SARS Coronavirus 2 by RT PCR NEGATIVE NEGATIVE Final    Comment: (NOTE) SARS-CoV-2 target nucleic acids are NOT DETECTED.  The SARS-CoV-2 RNA is generally detectable in upper respiratory specimens during the acute phase of infection. The lowest concentration of SARS-CoV-2 viral copies this assay can detect is 138 copies/mL. A negative result does not preclude SARS-Cov-2 infection and should not be used as the sole basis for treatment or other patient  management decisions. A negative result may occur with  improper specimen collection/handling, submission of specimen other than nasopharyngeal swab, presence of viral mutation(s) within the areas targeted by this assay, and inadequate number of viral copies(<138 copies/mL). A negative result must be combined with clinical observations, patient history, and epidemiological information. The expected result is Negative.  Fact Sheet for Patients:  BloggerCourse.com  Fact Sheet for Healthcare Providers:  SeriousBroker.it  This test is no t yet approved or cleared by the [place] FDA and  has been authorized for detection and/or diagnosis of SARS-CoV-2 by FDA under an Emergency Use Authorization (EUA). This EUA will remain  in effect (meaning this test can be used) for the duration of the COVID-19 declaration under Section 564(b)(1) of the Act, 21 U.S.C.section 360bbb-3(b)(1), unless the authorization is terminated  or revoked sooner.       Influenza A by PCR NEGATIVE NEGATIVE Final   Influenza B by PCR NEGATIVE NEGATIVE Final    Comment: (NOTE) The Xpert Xpress SARS-CoV-2/FLU/RSV plus assay is intended as an aid in the diagnosis of influenza from Nasopharyngeal swab specimens and should not be used as a sole basis for treatment. Nasal washings and aspirates are unacceptable for Xpert Xpress SARS-CoV-2/FLU/RSV testing.  Fact Sheet for Patients: BloggerCourse.com  Fact Sheet for Healthcare Providers: SeriousBroker.it  This test is not yet approved or cleared by the [place] FDA and has been authorized for detection and/or diagnosis of SARS-CoV-2 by FDA under an Emergency Use Authorization (EUA). This EUA will remain in effect (meaning this test can be used) for the duration of the COVID-19 declaration under Section 564(b)(1) of the Act, 21 U.S.C. section 360bbb-3(b)(1),  unless the authorization is terminated or revoked.  Performed at [facility], [address], [place], [place] [address]      Time coordinating discharge: Over 30 minutes  SIGNED:   Marinda Elk, MD  [facility] 04/17/2021, 12:43 PM Pager   If 7PM-7AM, please contact night-coverage [email or web address] Password [email or web address]

## 2021-07-19 IMAGING — DX DG CHEST 1V PORT
2 series · 2 of 2 positions shown · non-contrast
Comparison: 03/27/2010

CLINICAL DATA: Cough, fever

EXAM:
PORTABLE CHEST 1 VIEW

[chest ap (1 of 2)]
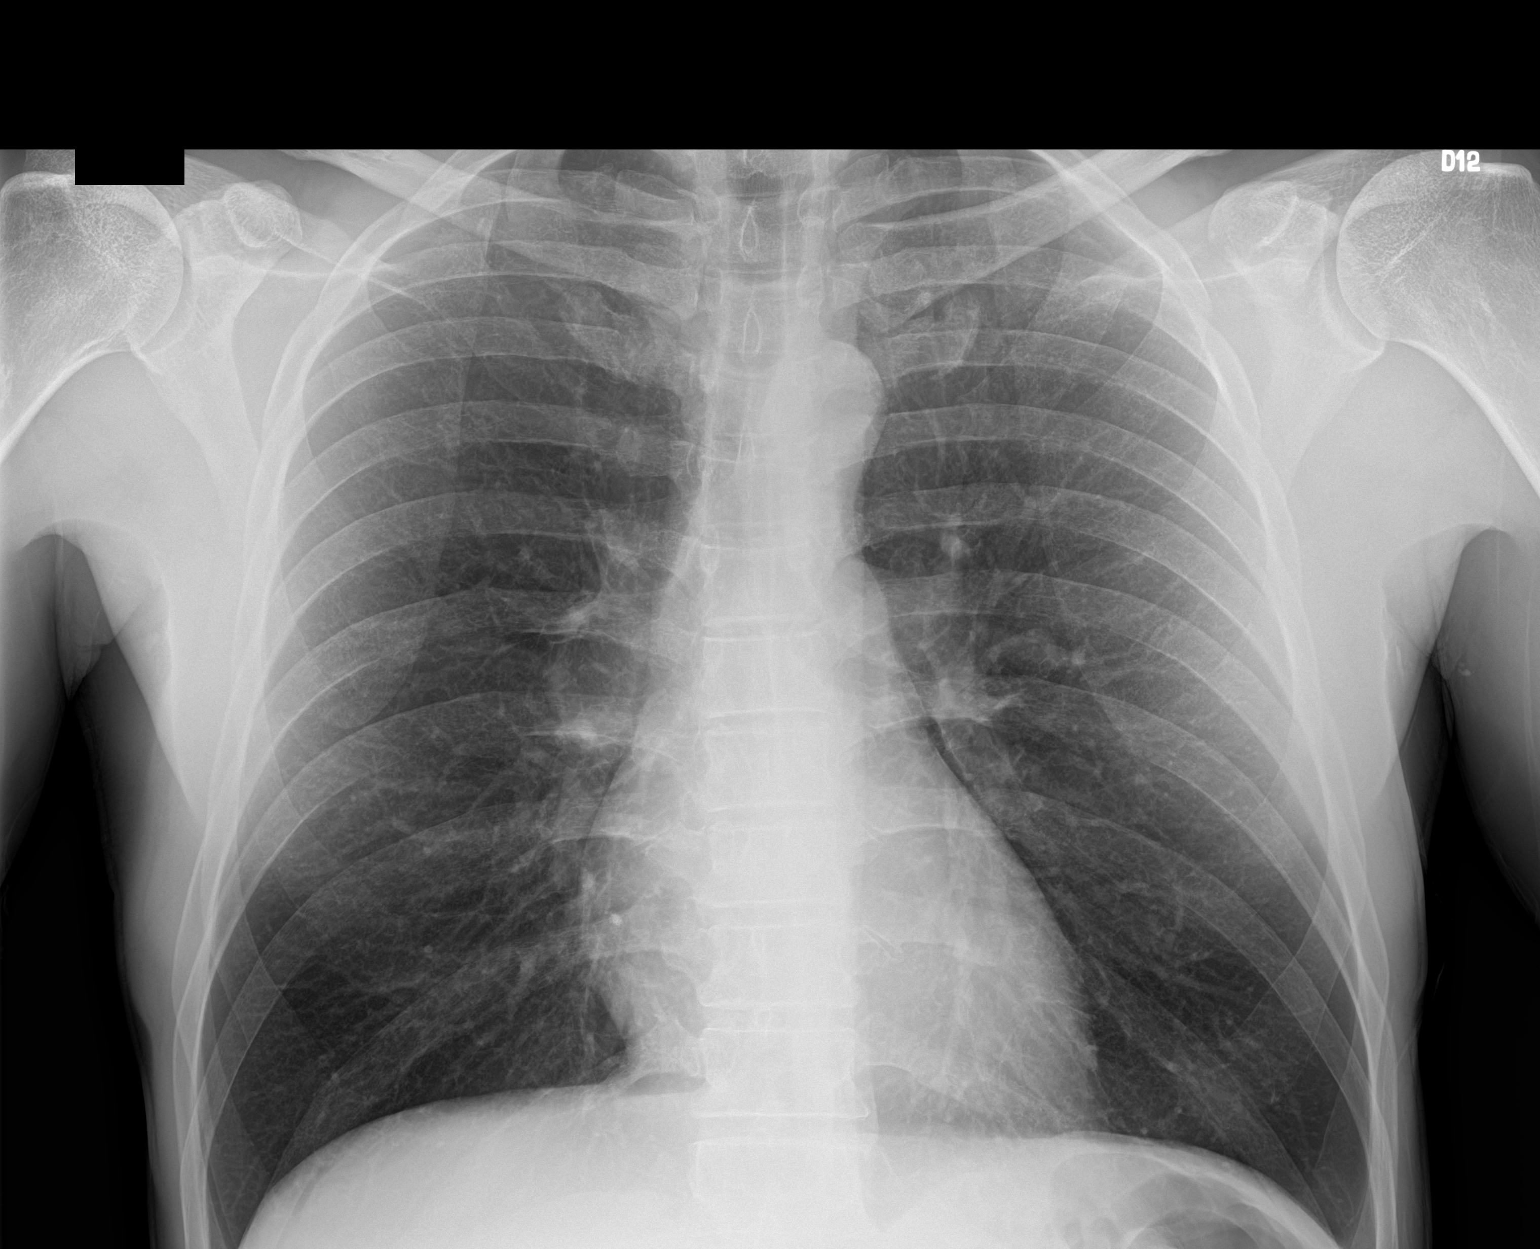

[chest ap (2 of 2)]
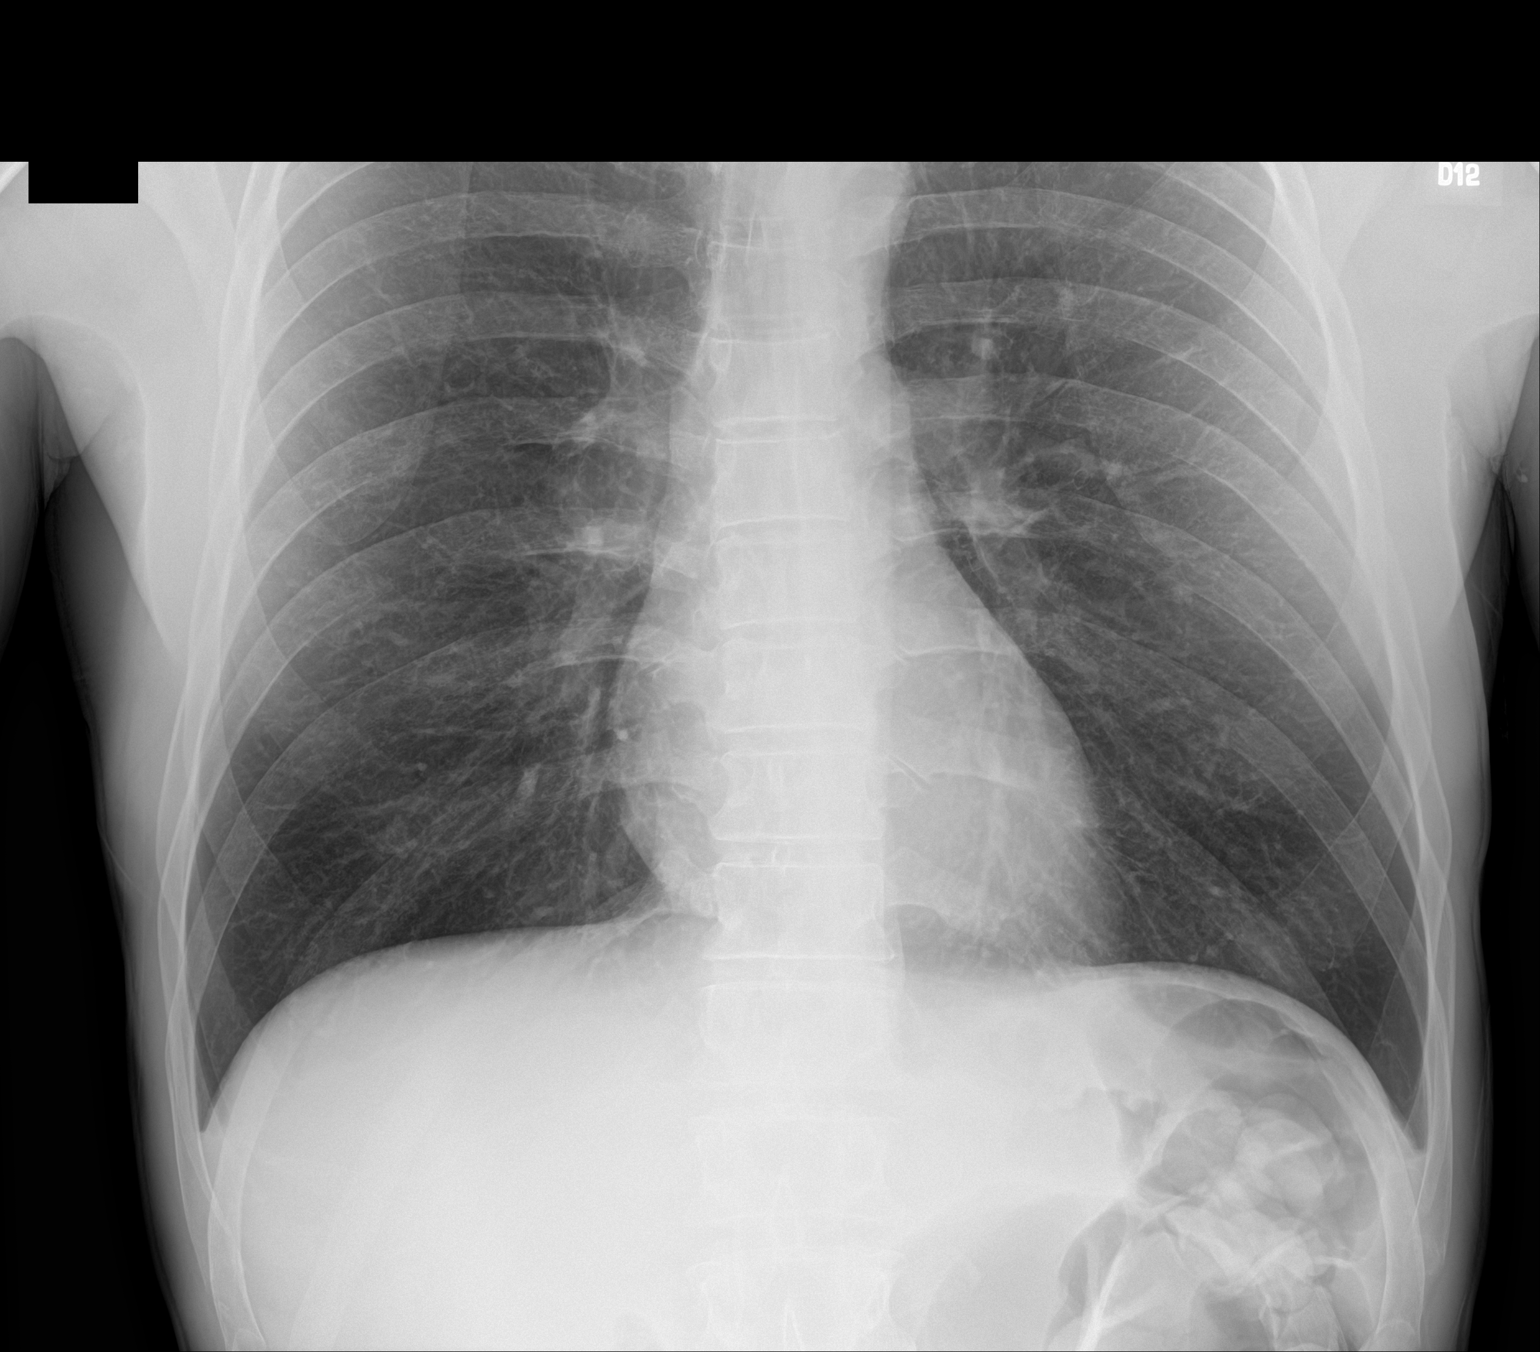

[2 of 2 positions shown; findings below may reference images not displayed]

FINDINGS: The heart size and mediastinal contours are within normal limits.
Both lungs are clear. The visualized skeletal structures are
unremarkable.
IMPRESSION: No acute abnormality of the lungs in AP portable projection.
